# Patient Record
Sex: Male | Born: 1985 | Race: Black or African American | Hispanic: No | Marital: Single | Smoking: Never smoker
Health system: Southern US, Community
[De-identification: ages and names within clinical notes are randomized; demographics above are authoritative.]

## PROBLEM LIST (undated history)

## (undated) DIAGNOSIS — I10 Essential (primary) hypertension: Secondary | ICD-10-CM

## (undated) DIAGNOSIS — F1910 Other psychoactive substance abuse, uncomplicated: Secondary | ICD-10-CM

## (undated) DIAGNOSIS — K469 Unspecified abdominal hernia without obstruction or gangrene: Secondary | ICD-10-CM

## (undated) DIAGNOSIS — F319 Bipolar disorder, unspecified: Secondary | ICD-10-CM

## (undated) DIAGNOSIS — K649 Unspecified hemorrhoids: Secondary | ICD-10-CM

## (undated) HISTORY — DX: Other psychoactive substance abuse, uncomplicated: F19.10

## (undated) HISTORY — PX: COLONOSCOPY: SHX174

---

## 2009-04-08 ENCOUNTER — Emergency Department (HOSPITAL_COMMUNITY): Admission: EM | Admit: 2009-04-08 | Discharge: 2009-04-09 | Payer: Self-pay | Admitting: Emergency Medicine

## 2009-06-26 ENCOUNTER — Emergency Department (HOSPITAL_COMMUNITY): Admission: EM | Admit: 2009-06-26 | Discharge: 2009-06-26 | Payer: Self-pay | Admitting: Emergency Medicine

## 2009-06-30 ENCOUNTER — Emergency Department (HOSPITAL_COMMUNITY): Admission: EM | Admit: 2009-06-30 | Discharge: 2009-06-30 | Payer: Self-pay | Admitting: Emergency Medicine

## 2009-08-07 ENCOUNTER — Encounter: Admission: RE | Admit: 2009-08-07 | Discharge: 2009-08-07 | Payer: Self-pay | Admitting: Occupational Medicine

## 2010-01-04 ENCOUNTER — Emergency Department (HOSPITAL_COMMUNITY): Admission: EM | Admit: 2010-01-04 | Discharge: 2010-01-05 | Payer: Self-pay | Admitting: Emergency Medicine

## 2010-09-06 IMAGING — CR DG ABDOMEN ACUTE W/ 1V CHEST
3 series · 3 of 3 positions shown · non-contrast
Comparison: None

CLINICAL DATA: Fever.  Cough.  Abdominal pain.

ACUTE ABDOMEN SERIES (ABDOMEN 2 VIEW & CHEST 1 VIEW)

[w chest pa]
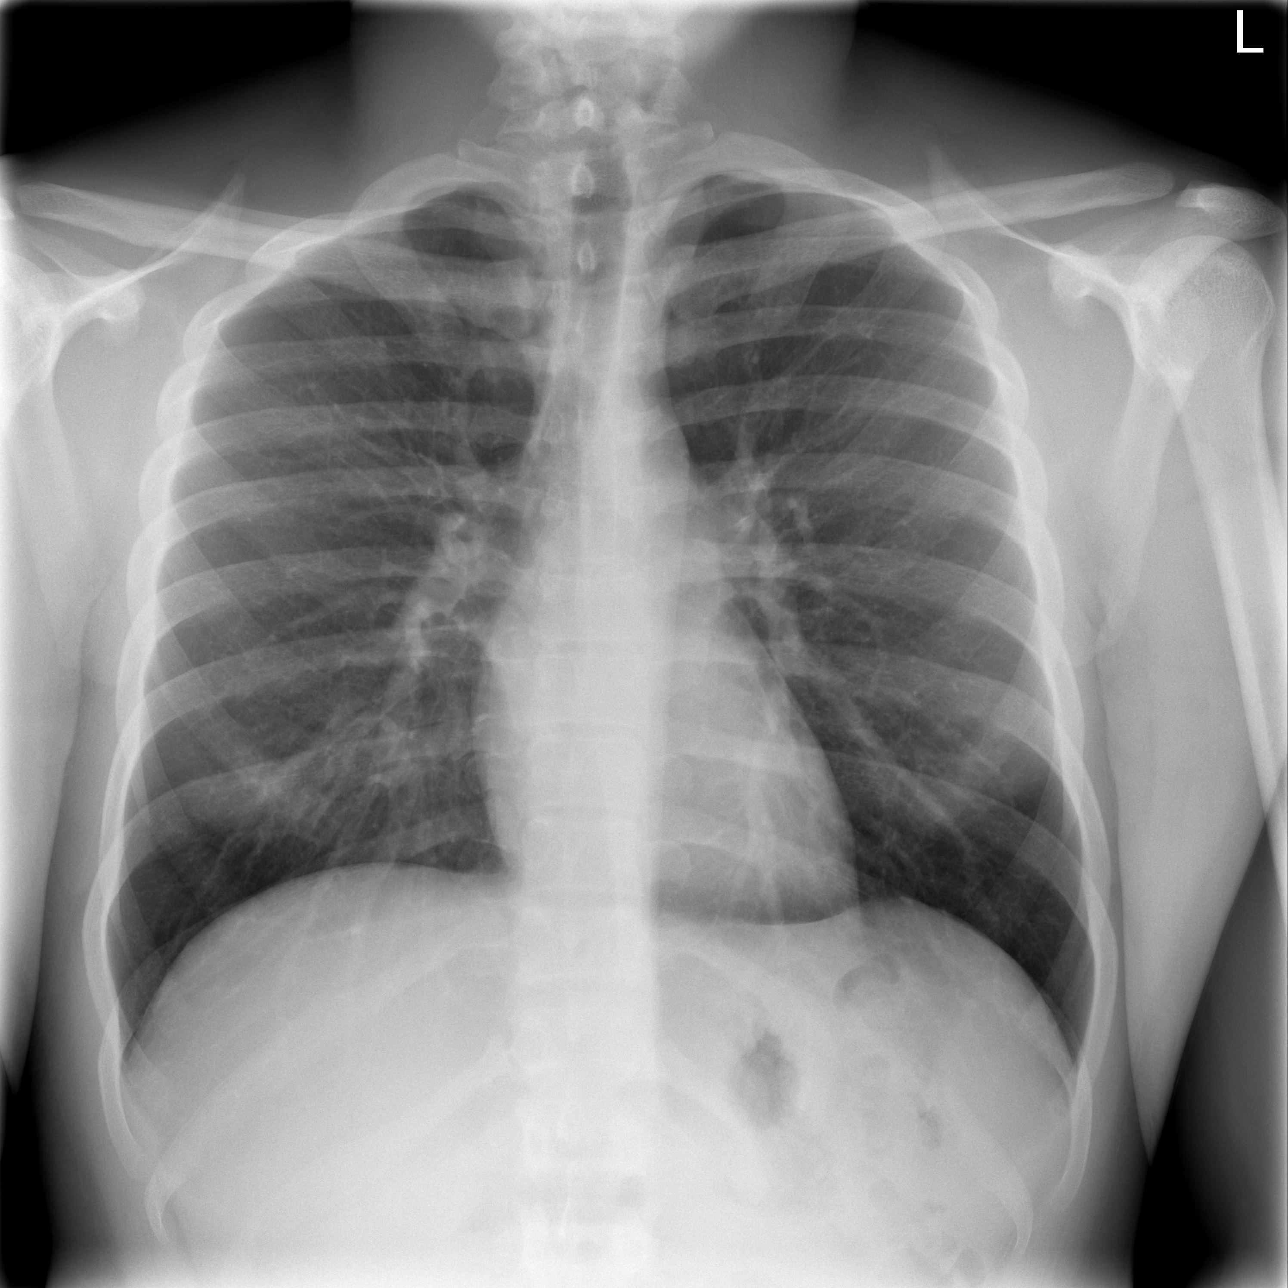

[w abdomen upright]
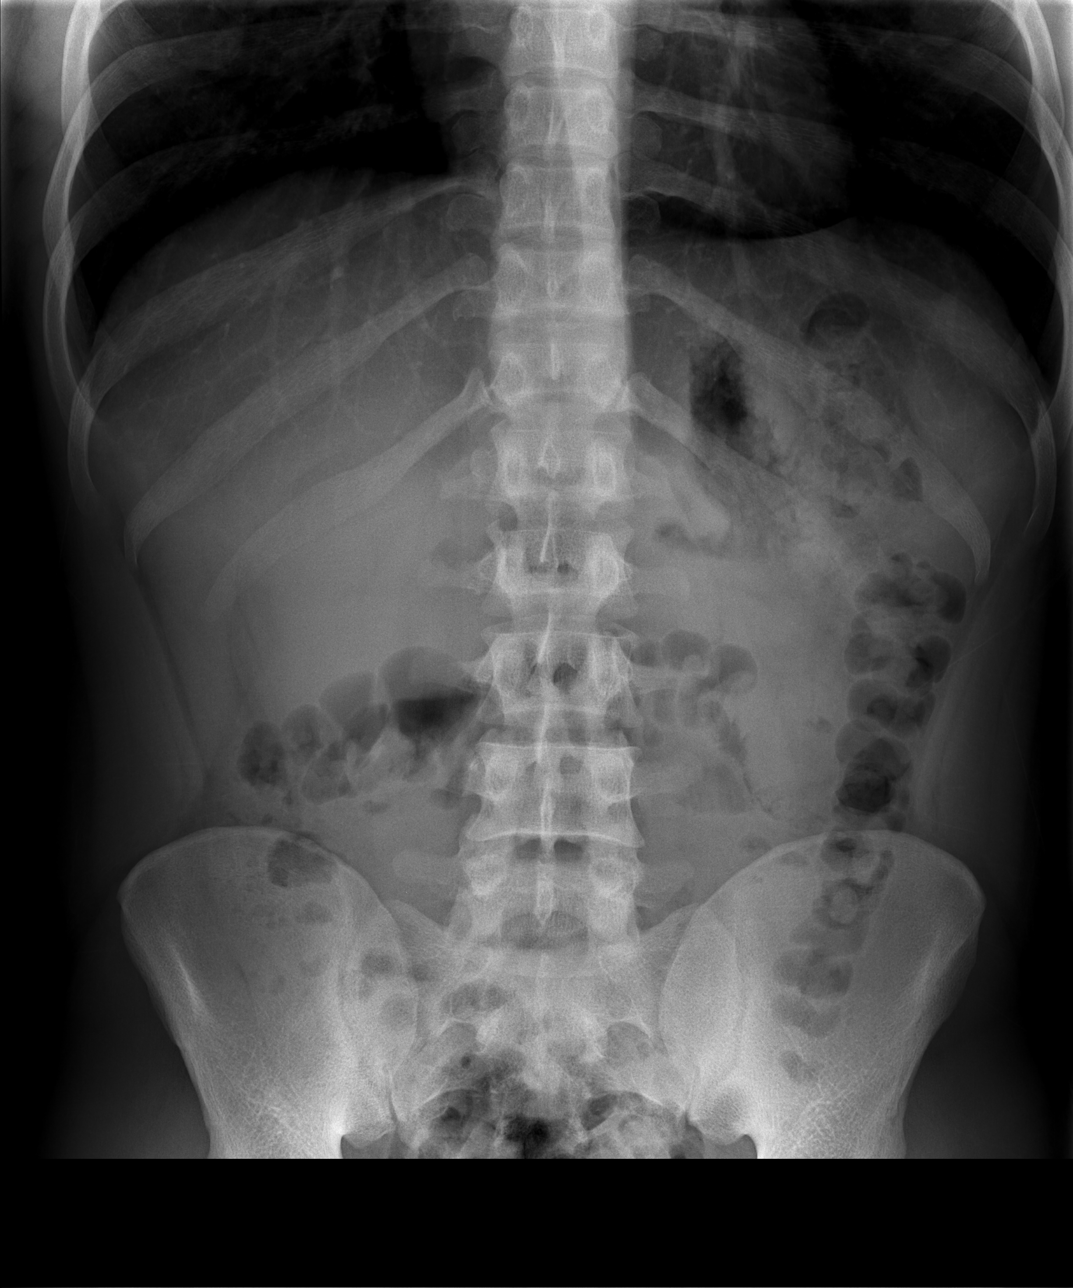

[t abdomen supine]
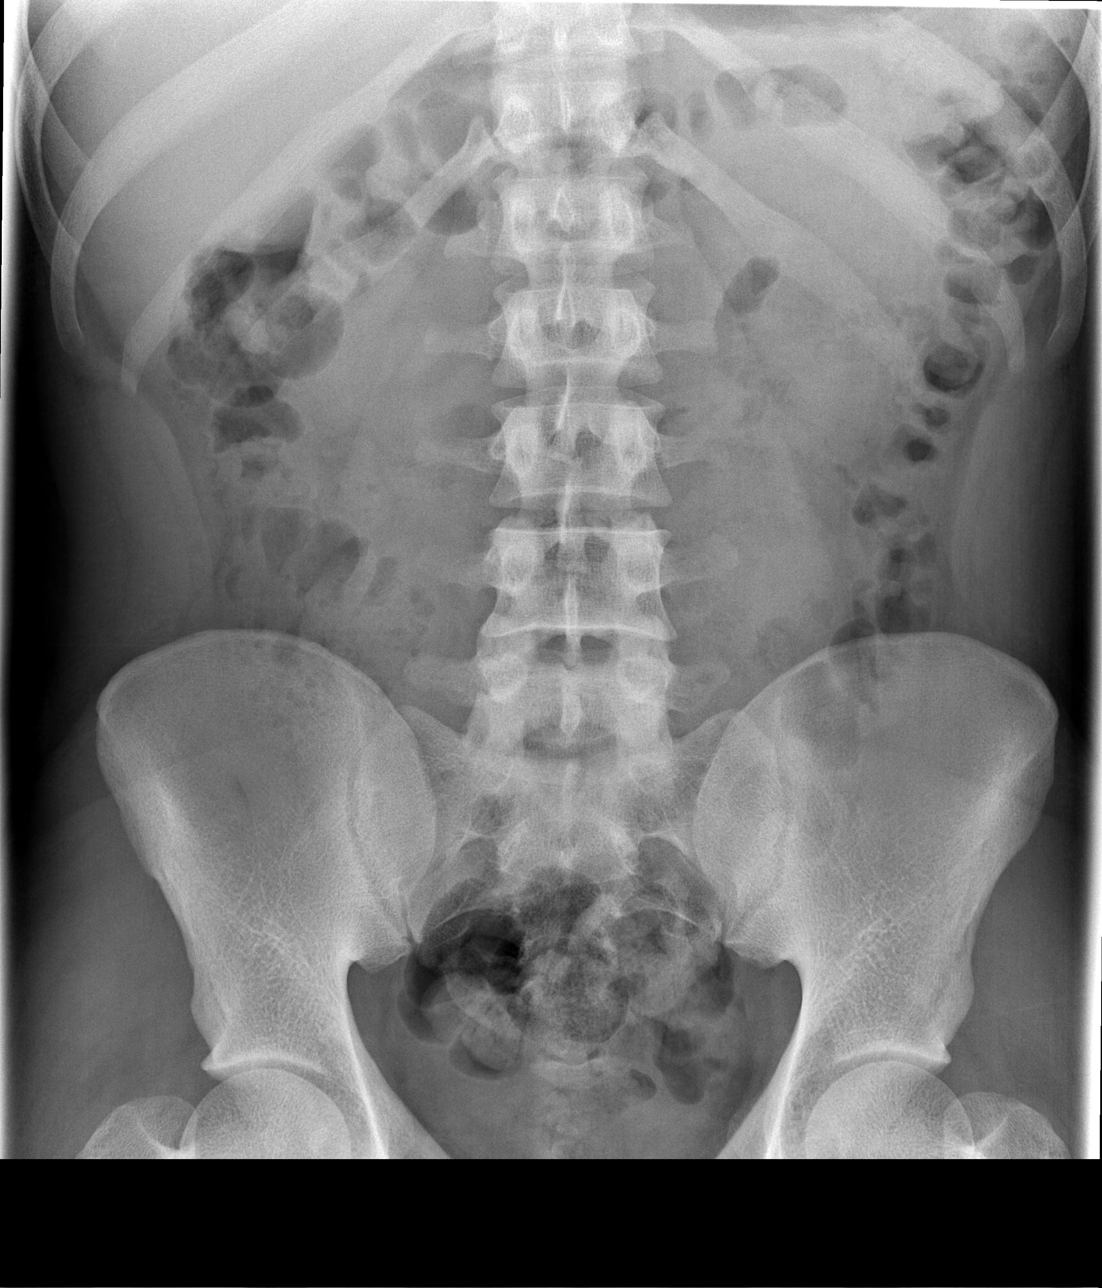

[3 of 3 positions shown; findings below may reference images not displayed]

FINDINGS: Bowel gas pattern is normal without evidence of ileus,
obstruction or free air.  No abnormal calcifications or bony
findings.

One-view chest shows normal heart and mediastinal shadows.  Lungs
are clear.  No infiltrate, collapse or effusion.  No abnormal bony
finding.
IMPRESSION: Negative radiographs

## 2010-10-28 LAB — URINE MICROSCOPIC-ADD ON

## 2010-10-28 LAB — URINALYSIS, ROUTINE W REFLEX MICROSCOPIC
Glucose, UA: NEGATIVE mg/dL
Ketones, ur: NEGATIVE mg/dL
Leukocytes, UA: NEGATIVE
Specific Gravity, Urine: 1.029 (ref 1.005–1.030)
pH: 6 (ref 5.0–8.0)

## 2010-11-13 LAB — URINALYSIS, ROUTINE W REFLEX MICROSCOPIC
Glucose, UA: NEGATIVE mg/dL
Hgb urine dipstick: NEGATIVE
Ketones, ur: >80 mg/dL — AB
Specific Gravity, Urine: 1.038 — ABNORMAL HIGH (ref 1.005–1.030)
pH: 7 (ref 5.0–8.0)

## 2010-11-13 LAB — CBC
HCT: 44.6 % (ref 39.0–52.0)
Hemoglobin: 15 g/dL (ref 13.0–17.0)
RBC: 5.53 MIL/uL (ref 4.22–5.81)
WBC: 12.8 10*3/uL — ABNORMAL HIGH (ref 4.0–10.5)

## 2010-11-13 LAB — COMPREHENSIVE METABOLIC PANEL
ALT: 87 U/L — ABNORMAL HIGH (ref 0–53)
Alkaline Phosphatase: 64 U/L (ref 39–117)
BUN: 8 mg/dL (ref 6–23)
Chloride: 101 mEq/L (ref 96–112)
Glucose, Bld: 98 mg/dL (ref 70–99)
Potassium: 4.4 mEq/L (ref 3.5–5.1)
Sodium: 135 mEq/L (ref 135–145)
Total Bilirubin: 0.9 mg/dL (ref 0.3–1.2)
Total Protein: 8 g/dL (ref 6.0–8.3)

## 2010-11-13 LAB — DIFFERENTIAL
Basophils Absolute: 0 10*3/uL (ref 0.0–0.1)
Basophils Relative: 0 % (ref 0–1)
Eosinophils Absolute: 0.1 10*3/uL (ref 0.0–0.7)
Monocytes Absolute: 0.9 10*3/uL (ref 0.1–1.0)
Monocytes Relative: 7 % (ref 3–12)
Neutro Abs: 5.9 10*3/uL (ref 1.7–7.7)
Neutrophils Relative %: 46 % (ref 43–77)

## 2010-11-13 LAB — URINE MICROSCOPIC-ADD ON

## 2010-11-13 LAB — MONONUCLEOSIS SCREEN: Mono Screen: NEGATIVE

## 2010-11-16 LAB — BASIC METABOLIC PANEL
CO2: 27 mEq/L (ref 19–32)
Calcium: 9 mg/dL (ref 8.4–10.5)
Creatinine, Ser: 0.97 mg/dL (ref 0.4–1.5)
GFR calc Af Amer: >60 mL/min (ref >60)
GFR calc non Af Amer: >60 mL/min (ref >60)
Glucose, Bld: 91 mg/dL (ref 70–99)
Sodium: 138 mEq/L (ref 135–145)

## 2010-11-16 LAB — DIFFERENTIAL
Basophils Relative: 0 % (ref 0–1)
Eosinophils Relative: 4 % (ref 0–5)
Lymphs Abs: 1.9 10*3/uL (ref 0.7–4.0)
Monocytes Absolute: 0.7 10*3/uL (ref 0.1–1.0)
Neutro Abs: 3.6 10*3/uL (ref 1.7–7.7)

## 2010-11-16 LAB — CBC
MCHC: 32.4 g/dL (ref 30.0–36.0)
RDW: 14 % (ref 11.5–15.5)

## 2010-11-16 LAB — POCT CARDIAC MARKERS
CKMB, poc: <1 ng/mL — ABNORMAL LOW (ref 1.0–8.0)
Myoglobin, poc: 41.4 ng/mL (ref 12–200)

## 2011-08-12 ENCOUNTER — Encounter: Payer: Self-pay | Admitting: *Deleted

## 2011-08-12 ENCOUNTER — Emergency Department (HOSPITAL_COMMUNITY)
Admission: EM | Admit: 2011-08-12 | Discharge: 2011-08-12 | Disposition: A | Discharge status: Home / Self Care | Payer: 59 | Source: Home / Self Care

## 2011-08-12 DIAGNOSIS — M25562 Pain in left knee: Secondary | ICD-10-CM

## 2011-08-12 DIAGNOSIS — M25569 Pain in unspecified knee: Secondary | ICD-10-CM

## 2011-08-12 NOTE — ED Notes (Signed)
PT  RE[PORTS  PROBLEMS  WITH  L  KNEE  IN PAST   HE  REPORTS  INCREASE  IN  PAIN    OVER  LAST  SEVERAL  DAYS  HE  REPORTS  PAIN ON  WEIGHT BEARING     HE  DENYS  ANY  RECENT  SPECEFIC  INJURY

## 2011-08-12 NOTE — ED Provider Notes (Signed)
History     CSN: [id number]  Arrival date & time 08/12/11  1344   None     Chief Complaint  Patient presents with  . Knee Pain    (Consider location/radiation/quality/duration/timing/severity/associated sxs/prior treatment) HPI Comments: Anthony Mullen is a very nice man with c/o Lt knee pain for several months. 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 He states that he has intermittently had discomfort which he has noticed more at the end of a work shift until the last several days. Pain has worsened in lateral knee and he know also feels a grinding or clicking in the lateral knee at times too. He has had no swelling. Tylenol provides some relief. He denies specific injury though he is active and has fallen onto his knees many times.    History reviewed. No pertinent past medical history.  History reviewed. No pertinent past surgical history.  History reviewed. No pertinent family history.  History  Substance Use Topics  . Smoking status: Never Smoker   . Smokeless tobacco: Not on file  . Alcohol Use: Yes      Review of Systems  Constitutional: Negative for fever and chills.  Musculoskeletal: Negative for myalgias, back pain and joint swelling.    Allergies  Review of patient's allergies indicates no known allergies.  Home Medications  No current outpatient prescriptions on file.  BP 131/76  Pulse 63  Temp(Src) 98.3 F (36.8 C) (Oral)  Resp 14  SpO2 99%  Physical Exam  Nursing note and vitals reviewed. Constitutional: He appears well-developed and well-nourished. No distress.  Musculoskeletal:       Left knee: He exhibits normal range of motion, no swelling, no effusion, no ecchymosis, no deformity, no erythema, normal alignment, no LCL laxity, normal patellar mobility, no bony tenderness, normal meniscus and no MCL laxity. no tenderness found. No medial joint line, no lateral joint line, no MCL, no LCL and no patellar tendon tenderness noted.     Legs: Neurological: He is alert.  Skin: Skin is warm and dry. No erythema.  Psychiatric: He has a normal mood and affect.    ED Course  Procedures (including critical care time)  Labs Reviewed - No data to display No results found.   1. Left knee pain       MDM   Lateral Lt knee pain, suspect torn meniscus. Pt referred to ortho for f/u.        Melody Comas, [place] 08/12/11 [phone]

## 2011-08-12 NOTE — ED Provider Notes (Signed)
Medical screening examination/treatment/procedure(s) were performed by non-physician practitioner and as supervising physician I was immediately available for consultation/collaboration.  Raynald Blend, MD 08/12/11 [phone]

## 2012-04-09 ENCOUNTER — Encounter (HOSPITAL_COMMUNITY): Payer: Self-pay | Admitting: *Deleted

## 2012-04-09 ENCOUNTER — Emergency Department (HOSPITAL_COMMUNITY): Admission: EM | Admit: 2012-04-09 | Payer: 59 | Source: Home / Self Care

## 2012-04-09 ENCOUNTER — Emergency Department (HOSPITAL_COMMUNITY)
Admission: EM | Admit: 2012-04-09 | Discharge: 2012-04-09 | Disposition: A | Discharge status: Home / Self Care | Payer: Worker's Compensation | Attending: Emergency Medicine | Admitting: Emergency Medicine

## 2012-04-09 DIAGNOSIS — Z7721 Contact with and (suspected) exposure to potentially hazardous body fluids: Secondary | ICD-10-CM | POA: Insufficient documentation
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
## 2012-09-14 ENCOUNTER — Encounter (HOSPITAL_COMMUNITY): Payer: Self-pay | Admitting: *Deleted

## 2012-09-14 ENCOUNTER — Emergency Department (HOSPITAL_COMMUNITY)
Admission: EM | Admit: 2012-09-14 | Discharge: 2012-09-15 | Disposition: A | Discharge status: Home / Self Care | Payer: 59 | Attending: Emergency Medicine | Admitting: Emergency Medicine

## 2012-09-14 DIAGNOSIS — K921 Melena: Secondary | ICD-10-CM | POA: Insufficient documentation

## 2012-09-14 DIAGNOSIS — K625 Hemorrhage of anus and rectum: Secondary | ICD-10-CM | POA: Insufficient documentation

## 2012-09-14 LAB — COMPREHENSIVE METABOLIC PANEL
ALT: 10 U/L (ref 0–53)
Albumin: 4.5 g/dL (ref 3.5–5.2)
Calcium: 9.5 mg/dL (ref 8.4–10.5)
GFR calc Af Amer: >90 mL/min (ref >90)
Glucose, Bld: 94 mg/dL (ref 70–99)
Sodium: 140 mEq/L (ref 135–145)
Total Protein: 7.8 g/dL (ref 6.0–8.3)

## 2012-09-14 LAB — CBC WITH DIFFERENTIAL/PLATELET
Basophils Relative: 0 % (ref 0–1)
Eosinophils Absolute: 0 10*3/uL (ref 0.0–0.7)
Eosinophils Relative: 0 % (ref 0–5)
Lymphs Abs: 2.6 10*3/uL (ref 0.7–4.0)
MCH: 27.1 pg (ref 26.0–34.0)
MCHC: 34.3 g/dL (ref 30.0–36.0)
MCV: 79.1 fL (ref 78.0–100.0)
Platelets: 281 10*3/uL (ref 150–400)
RBC: 5.31 MIL/uL (ref 4.22–5.81)
RDW: 14 % (ref 11.5–15.5)

## 2012-09-14 LAB — OCCULT BLOOD, POC DEVICE: Fecal Occult Bld: POSITIVE — AB

## 2012-09-14 LAB — ABO/RH: ABO/RH(D): A POS

## 2012-09-14 LAB — TYPE AND SCREEN: ABO/RH(D): A POS

## 2012-09-14 NOTE — ED Notes (Signed)
Pt states that he has been thirsty over the past couple of days, pt states that he also has an increased hunger, pt state that he eats and then has severe abdominal cramping. Pt states that 4 days ago he noticed black stools and over the last four days he has been having redness increased to a bright red. Pt denies cramping now but states some discomfort. Pt alert and oriented. Pt denies vomiting some nausea.

## 2012-09-14 NOTE — ED Provider Notes (Addendum)
History     CSN: [id number]  Arrival date & time 09/14/12  1959   First MD Initiated Contact with Patient 09/14/12 2204      Chief Complaint  Patient presents with  . Rectal Bleeding    (Consider location/radiation/quality/duration/timing/severity/associated sxs/prior treatment) Patient is a 27 y.o. male presenting with hematochezia. The history is provided by the patient.  Rectal Bleeding  Pertinent negatives include no fever, no abdominal pain, no hematuria, no chest pain, no coughing and no rash.  pt c/o rectal bleeding today. States recent health at baseline, having normal bms. Pt states normal for him is 2-4 bms per day. States today had 3 bms, formed, soft, brown, but with blood streaks and/or blood around stool. Bright red. No melena. Denies abdominal pain. No nv. No abd distension. No rectal pain or injury. No recent constipation or straining. Hx hemorrhoids. No hx anal fissure. No hx prior gi bleeding. No hx gi bleed, no hx diverticula, no hx avm.  Denies any nsaid or asa use. No faintness, lightheadedness or syncope.   History reviewed. No pertinent past medical history.  History reviewed. No pertinent past surgical history.  History reviewed. No pertinent family history.  History  Substance Use Topics  . Smoking status: Never Smoker   . Smokeless tobacco: Not on file  . Alcohol Use: Yes      Review of Systems  Constitutional: Negative for fever and chills.  HENT: Negative for nosebleeds.   Respiratory: Negative for cough.   Cardiovascular: Negative for chest pain and leg swelling.  Gastrointestinal: Positive for blood in stool and hematochezia. Negative for abdominal pain.  Genitourinary: Negative for hematuria and flank pain.  Musculoskeletal: Negative for back pain.  Skin: Negative for rash.  Neurological: Negative for syncope and light-headedness.  Hematological: Does not bruise/bleed easily.  Psychiatric/Behavioral: Negative for confusion.    Allergies   Review of patient's allergies indicates no known allergies.  Home Medications  No current outpatient prescriptions on file.  BP 140/62  Pulse 106  Temp 98.7 F (37.1 C) (Oral)  Resp 16  SpO2 100%  Physical Exam  Nursing note and vitals reviewed. Constitutional: He is oriented to person, place, and time. He appears well-developed and well-nourished. No distress.       Conj/nail beds, normal color.   HENT:  Mouth/Throat: Oropharynx is clear and moist.  Eyes: Conjunctivae normal are normal. No scleral icterus.  Neck: Neck supple. No tracheal deviation present.  Cardiovascular: Normal rate, regular rhythm, normal heart sounds and intact distal pulses.   Pulmonary/Chest: Effort normal and breath sounds normal. No accessory muscle usage. No respiratory distress.  Abdominal: Soft. Bowel sounds are normal. He exhibits no distension and no mass. There is no tenderness. There is no rebound and no guarding.  Genitourinary:       Pt complies/tolerates rectal exam for split second - inadequate to r/o/feel mass.  Light brown stool, streaks blood/mucous, heme pos.   Musculoskeletal: Normal range of motion. He exhibits no edema and no tenderness.  Neurological: He is alert and oriented to person, place, and time.  Skin: Skin is warm and dry.       No petechia  Psychiatric: He has a normal mood and affect.    ED Course  Procedures (including critical care time)  Results for orders placed during the hospital encounter of 09/14/12  CBC WITH DIFFERENTIAL      Component Value Range   WBC 8.7  4.0 - 10.5 K/uL   RBC 5.31  4.22 - 5.81 MIL/uL   Hemoglobin 14.4  13.0 - 17.0 g/dL   HCT 16.1  09.6 - 04.5 %   MCV 79.1  78.0 - 100.0 fL   MCH 27.1  26.0 - 34.0 pg   MCHC 34.3  30.0 - 36.0 g/dL   RDW 40.9  81.1 - 91.4 %   Platelets 281  150 - 400 K/uL   Neutrophils Relative 62  43 - 77 %   Neutro Abs 5.4  1.7 - 7.7 K/uL   Lymphocytes Relative 30  12 - 46 %   Lymphs Abs 2.6  0.7 - 4.0 K/uL    Monocytes Relative 8  3 - 12 %   Monocytes Absolute 0.7  0.1 - 1.0 K/uL   Eosinophils Relative 0  0 - 5 %   Eosinophils Absolute 0.0  0.0 - 0.7 K/uL   Basophils Relative 0  0 - 1 %   Basophils Absolute 0.0  0.0 - 0.1 K/uL  COMPREHENSIVE METABOLIC PANEL      Component Value Range   Sodium 140  135 - 145 mEq/L   Potassium 3.5  3.5 - 5.1 mEq/L   Chloride 104  96 - 112 mEq/L   CO2 23  19 - 32 mEq/L   Glucose, Bld 94  70 - 99 mg/dL   BUN 16  6 - 23 mg/dL   Creatinine, Ser 7.82  0.50 - 1.35 mg/dL   Calcium 9.5  8.4 - 95.6 mg/dL   Total Protein 7.8  6.0 - 8.3 g/dL   Albumin 4.5  3.5 - 5.2 g/dL   AST 19  0 - 37 U/L   ALT 10  0 - 53 U/L   Alkaline Phosphatase 56  39 - 117 U/L   Total Bilirubin 0.2 (*) 0.3 - 1.2 mg/dL   GFR calc non Af Amer >90  >90 mL/min   GFR calc Af Amer >90  >90 mL/min  TYPE AND SCREEN      Component Value Range   ABO/RH(D) A POS     Antibody Screen NEG     Sample Expiration 09/17/2012    ABO/RH      Component Value Range   ABO/RH(D) A POS    OCCULT BLOOD, POC DEVICE      Component Value Range   Fecal Occult Bld POSITIVE (*) NEGATIVE      MDM  Labs.  hgb normal. Bun normal. Light brown stool, no melena. Vitals normal. abd soft nt no pain As such feel stable for d/c.  Will refer to close gi f/u.    Recheck no episodes rectal bleeding or bloody bms in ed. abd soft nt.  Ambulatory, no faintness or dizziness.  Pt appears stable for d/c. Discussed plan of close gi f/u w pt.      Suzi Roots, MD 09/14/12 2130  Suzi Roots, MD 09/14/12 [phone]

## 2012-09-15 NOTE — ED Notes (Signed)
Pt ambulating independently w/ steady gait on d/c in no acute distress, A&Ox4.D/c instructions reviewed w/ pt and family - pt and family deny any further questions or concerns at present.  

## 2012-12-08 ENCOUNTER — Encounter (HOSPITAL_COMMUNITY): Payer: Self-pay

## 2012-12-08 ENCOUNTER — Emergency Department (HOSPITAL_COMMUNITY)
Admission: EM | Admit: 2012-12-08 | Discharge: 2012-12-08 | Disposition: A | Discharge status: Home / Self Care | Payer: 59 | Attending: Emergency Medicine | Admitting: Emergency Medicine

## 2012-12-08 DIAGNOSIS — Z8719 Personal history of other diseases of the digestive system: Secondary | ICD-10-CM | POA: Insufficient documentation

## 2012-12-08 DIAGNOSIS — R112 Nausea with vomiting, unspecified: Secondary | ICD-10-CM | POA: Insufficient documentation

## 2012-12-08 DIAGNOSIS — Z9889 Other specified postprocedural states: Secondary | ICD-10-CM | POA: Insufficient documentation

## 2012-12-08 DIAGNOSIS — R109 Unspecified abdominal pain: Secondary | ICD-10-CM | POA: Insufficient documentation

## 2012-12-08 DIAGNOSIS — R197 Diarrhea, unspecified: Secondary | ICD-10-CM | POA: Insufficient documentation

## 2012-12-08 DIAGNOSIS — I10 Essential (primary) hypertension: Secondary | ICD-10-CM | POA: Insufficient documentation

## 2012-12-08 HISTORY — DX: Essential (primary) hypertension: I10

## 2012-12-08 LAB — CBC WITH DIFFERENTIAL/PLATELET
Basophils Absolute: 0 10*3/uL (ref 0.0–0.1)
Eosinophils Relative: 1 % (ref 0–5)
Lymphocytes Relative: 9 % — ABNORMAL LOW (ref 12–46)
MCV: 75.8 fL — ABNORMAL LOW (ref 78.0–100.0)
Neutrophils Relative %: 85 % — ABNORMAL HIGH (ref 43–77)
Platelets: 272 10*3/uL (ref 150–400)
RDW: 13.7 % (ref 11.5–15.5)
WBC: 15 10*3/uL — ABNORMAL HIGH (ref 4.0–10.5)

## 2012-12-08 LAB — COMPREHENSIVE METABOLIC PANEL
ALT: 15 U/L (ref 0–53)
AST: 19 U/L (ref 0–37)
CO2: 23 mEq/L (ref 19–32)
Calcium: 10 mg/dL (ref 8.4–10.5)
GFR calc non Af Amer: >90 mL/min (ref >90)
Potassium: 4.4 mEq/L (ref 3.5–5.1)
Sodium: 139 mEq/L (ref 135–145)
Total Protein: 8.8 g/dL — ABNORMAL HIGH (ref 6.0–8.3)

## 2012-12-08 MED ORDER — ONDANSETRON 4 MG PO TBDP
8.0000 mg | ORAL_TABLET | Freq: Once | ORAL | Status: AC
  Administered 2012-12-08: 8 mg via ORAL
  Filled 2012-12-08: qty 2

## 2012-12-08 MED ORDER — ONDANSETRON 8 MG PO TBDP: 8.0000 mg | ORAL_TABLET | Freq: Three times a day (TID) | ORAL | Status: DC | PRN

## 2012-12-08 MED ORDER — SODIUM CHLORIDE 0.9 % IV BOLUS (SEPSIS)
1000.0000 mL | Freq: Once | INTRAVENOUS | Status: AC
  Administered 2012-12-08: 1000 mL via INTRAVENOUS

## 2012-12-08 MED ORDER — ONDANSETRON HCL 4 MG/2ML IJ SOLN: 4.0000 mg | Freq: Once | INTRAMUSCULAR | Status: DC

## 2012-12-08 NOTE — ED Notes (Signed)
Patient presents with c/o generalized abdominal pain, nausea, vomiting and diarrhea x 1 day. Unable to tolerate POs. Daughter just recently recovering from "stomach bug." No fevers, sweats or chills.

## 2012-12-08 NOTE — ED Provider Notes (Signed)
History     CSN: [id number]  Arrival date & time 12/08/12  2536   First MD Initiated Contact with Patient 12/08/12 [phone]      Chief Complaint  Patient presents with  . Abdominal Pain  . Nausea  . Emesis    (Consider location/radiation/quality/duration/timing/severity/associated sxs/prior treatment) HPI 27 yo male presents to the ER with complaint of n/v/d with abdominal cramping starting at 6 pm last night.  No fever, chills, no blood noted in stool or emesis.  No travel, no unusual foods.  Pt's daughter with GI illness and fever recently.  Pt had colonoscopy this past month due to rectal bleeding. Was told he had polyp.   Past Medical History  Diagnosis Date  . Hypertension     Past Surgical History  Procedure Laterality Date  . Colonoscopy      No family history on file.  History  Substance Use Topics  . Smoking status: Never Smoker   . Smokeless tobacco: Never Used  . Alcohol Use: Yes      Review of Systems  All other systems reviewed and are negative.    Allergies  Review of patient's allergies indicates no known allergies.  Home Medications   Current Outpatient Rx  Name  Route  Sig  Dispense  Refill  . bismuth subsalicylate (PEPTO BISMOL) 262 MG/15ML suspension   Oral   Take 15 mLs by mouth every 6 (six) hours as needed for indigestion.           BP 126/61  Pulse 93  Temp(Src) 98.9 F (37.2 C) (Oral)  Resp 18  SpO2 98%  Physical Exam  Nursing note and vitals reviewed. Constitutional: He is oriented to person, place, and time. He appears well-developed and well-nourished. He appears distressed (in pain, tearful).  HENT:  Head: Normocephalic and atraumatic.  Nose: Nose normal.  Dry mucous membranes  Neck: Normal range of motion. Neck supple. No JVD present. No tracheal deviation present. No thyromegaly present.  Cardiovascular: Normal rate, regular rhythm, normal heart sounds and intact distal pulses.  Exam reveals no gallop and no  friction rub.   No murmur heard. Pulmonary/Chest: Effort normal and breath sounds normal. No stridor. No respiratory distress. He has no wheezes. He has no rales. He exhibits no tenderness.  Abdominal: Soft. He exhibits no distension and no mass. There is no tenderness. There is no rebound and no guarding.  Decreased bowel sounds.  No pain on palpation  Musculoskeletal: Normal range of motion. He exhibits no edema and no tenderness.  Lymphadenopathy:    He has no cervical adenopathy.  Neurological: He is alert and oriented to person, place, and time. He exhibits normal muscle tone. Coordination normal.  Skin: Skin is warm and dry. No rash noted. No erythema. No pallor.  Psychiatric: He has a normal mood and affect. His behavior is normal. Judgment and thought content normal.    ED Course  Procedures (including critical care time)  Labs Reviewed  CBC WITH DIFFERENTIAL - Abnormal; Notable for the following:    WBC 15.0 (*)    RBC 6.32 (*)    MCV 75.8 (*)    Neutrophils Relative 85 (*)    Neutro Abs 12.8 (*)    Lymphocytes Relative 9 (*)    All other components within normal limits  COMPREHENSIVE METABOLIC PANEL - Abnormal; Notable for the following:    Glucose, Bld 122 (*)    Total Protein 8.8 (*)    All other components within normal limits  LIPASE, BLOOD  URINALYSIS, MICROSCOPIC ONLY   No results found.   1. Nausea vomiting and diarrhea       MDM  27 yo male with n/v/d, abd pain.  Suspect viral gastroenteritis.  Will give zofran, ivf, check baseline labs.   6:48 AM Pt feeling better.  Labs show leukocytosis, most likely from vomiting.  Abd soft, nt, no rebound/guarding.  Will po challenge and plan for d/c home      Olivia Mackie, MD 12/08/12 [phone]

## 2013-03-29 ENCOUNTER — Emergency Department (HOSPITAL_COMMUNITY)
Admission: EM | Admit: 2013-03-29 | Discharge: 2013-03-29 | Disposition: A | Discharge status: Home / Self Care | Payer: Worker's Compensation | Attending: Emergency Medicine | Admitting: Emergency Medicine

## 2013-03-29 ENCOUNTER — Emergency Department (HOSPITAL_COMMUNITY): Payer: Worker's Compensation

## 2013-03-29 ENCOUNTER — Encounter (HOSPITAL_COMMUNITY): Payer: Self-pay | Admitting: Emergency Medicine

## 2013-03-29 DIAGNOSIS — I10 Essential (primary) hypertension: Secondary | ICD-10-CM | POA: Insufficient documentation

## 2013-03-29 DIAGNOSIS — R0602 Shortness of breath: Secondary | ICD-10-CM | POA: Insufficient documentation

## 2013-03-29 DIAGNOSIS — Z7982 Long term (current) use of aspirin: Secondary | ICD-10-CM | POA: Insufficient documentation

## 2013-03-29 DIAGNOSIS — R079 Chest pain, unspecified: Secondary | ICD-10-CM

## 2013-03-29 DIAGNOSIS — R61 Generalized hyperhidrosis: Secondary | ICD-10-CM | POA: Insufficient documentation

## 2013-03-29 LAB — POCT I-STAT, CHEM 8
BUN: 10 mg/dL (ref 6–23)
Calcium, Ion: 1.21 mmol/L (ref 1.12–1.23)
Chloride: 106 mEq/L (ref 96–112)
Creatinine, Ser: 1 mg/dL (ref 0.50–1.35)
Glucose, Bld: 96 mg/dL (ref 70–99)
HCT: 44 % (ref 39.0–52.0)
Hemoglobin: 15 g/dL (ref 13.0–17.0)
Potassium: 4 mEq/L (ref 3.5–5.1)
Sodium: 142 mEq/L (ref 135–145)
TCO2: 26 mmol/L (ref 0–100)

## 2013-03-29 LAB — CBC WITH DIFFERENTIAL/PLATELET
Eosinophils Absolute: 0.2 10*3/uL (ref 0.0–0.7)
Eosinophils Relative: 3 % (ref 0–5)
Hemoglobin: 14.1 g/dL (ref 13.0–17.0)
Lymphs Abs: 1.8 10*3/uL (ref 0.7–4.0)
MCH: 26.3 pg (ref 26.0–34.0)
MCV: 79.5 fL (ref 78.0–100.0)
Monocytes Relative: 10 % (ref 3–12)
RBC: 5.37 MIL/uL (ref 4.22–5.81)

## 2013-03-29 LAB — TROPONIN I: Troponin I: <0.3 ng/mL (ref <0.30)

## 2013-03-29 LAB — D-DIMER, QUANTITATIVE: D-Dimer, Quant: <0.27 ug/mL-FEU (ref 0.00–0.48)

## 2013-03-29 MED ORDER — ASPIRIN 81 MG PO CHEW: 81.0000 mg | CHEWABLE_TABLET | Freq: Every day | ORAL | Status: DC

## 2013-03-29 NOTE — ED Provider Notes (Signed)
CSN: [id number]     Arrival date & time 03/29/13  1110 History     First MD Initiated Contact with Patient 03/29/13 1120     Chief Complaint  Patient presents with  . Chest Pain   (Consider location/radiation/quality/duration/timing/severity/associated sxs/prior Treatment) HPI Comments: 27 year old male with no significant past medical history who presents with a complaint of chest pain. 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 This was intense and felt as if he was going to get down to his knees, he did not have any syncope, he was diaphoretic. The symptoms were persistent and lasted approximately 10 minutes, have gradually eased off, received aspirin and nitroglycerin in the ambulance which may have helped. He is very active, lifts weights frequently and does cardio activity at the gym and never has to stop because of these symptoms. He does not smoke cigarettes, drinks occasional alcohol, takes no other per prescription medications.  Patient is a 27 y.o. male presenting with chest pain.  Chest Pain   Past Medical History  Diagnosis Date  . Hypertension    Past Surgical History  Procedure Laterality Date  . Colonoscopy     History reviewed. No pertinent family history. History  Substance Use Topics  . Smoking status: Never Smoker   . Smokeless tobacco: Never Used  . Alcohol Use: Yes    Review of Systems  Cardiovascular: Positive for chest pain.  All other systems reviewed and are negative.    Allergies  Review of patient's allergies indicates no known allergies.  Home Medications   Current Outpatient Rx  Name  Route  Sig  Dispense  Refill  . hyoscyamine (LEVSIN, ANASPAZ) 0.125 MG tablet   Oral   Take 0.125 mg by mouth 4 (four) times daily as needed  for cramping.         Marland Kitchen aspirin 81 MG chewable tablet   Oral   Chew 1 tablet (81 mg total) by mouth daily.   30 tablet   0    BP 115/48  Pulse 58  Temp(Src) 98.2 F (36.8 C) (Oral)  Resp 20  SpO2 97% Physical Exam  Nursing note and vitals reviewed. Constitutional: He appears well-developed and well-nourished. No distress.  HENT:  Head: Normocephalic and atraumatic.  Mouth/Throat: Oropharynx is clear and moist. No oropharyngeal exudate.  Eyes: Conjunctivae and EOM are normal. Pupils are equal, round, and reactive to light. Right eye exhibits no discharge. Left eye exhibits no discharge. No scleral icterus.  Neck: Normal range of motion. Neck supple. No JVD present. No thyromegaly present.  Cardiovascular: Normal rate, regular rhythm, normal heart sounds and intact distal pulses.  Exam reveals no gallop and no friction rub.   No murmur heard. Pulmonary/Chest: Effort normal and breath sounds normal. No respiratory distress. He has no wheezes. He has no rales.  Abdominal: Soft. Bowel sounds are normal. He exhibits no distension and no mass. There is no tenderness.  Musculoskeletal: Normal range of motion. He exhibits no edema and no tenderness.  Lymphadenopathy:    He has no cervical adenopathy.  Neurological: He is alert. Coordination normal.  Skin: Skin is warm and dry. No rash noted. No erythema.  Psychiatric: He has a normal mood and affect. His behavior is normal.    ED Course  Procedures (including critical care time)  Labs Reviewed  CBC WITH DIFFERENTIAL  SEDIMENTATION RATE  TROPONIN I  D-DIMER, QUANTITATIVE  POCT I-STAT, CHEM 8   Dg Chest 2 View  03/29/2013   *RADIOLOGY REPORT*  Clinical Data: Short of breath and chest pain  CHEST - 2 VIEW  Comparison: 08/07/2009  Findings: Heart size and vascularity are normal.  Lungs are clear. Negative for pneumonia or mass.  No pleural effusion.  IMPRESSION: Negative   Original Report Authenticated By: Janeece Riggers, M.D.    1. Chest pain     MDM  The patient is well appearing at this time, he will need an EKG, laboratory workup and a chest x-ray. I will also obtain a d-dimer as his symptoms should not be consistent with unstable angina. He has no risk factors for cardiac disease and I have reviewed his EMS EKGs while he was having chest pain on their initial arrival and they are completely normal with no signs of ischemia. D-dimer pending though the patient denies travel, trauma, immobilization, hormone use, cancer, swelling of the legs, recent surgery.  ED ECG REPORT  I personally interpreted this EKG   Date: 03/29/2013   Rate: 73  Rhythm: normal sinus rhythm  QRS Axis: normal  Intervals: normal  ST/T Wave abnormalities: early repolarization  Conduction Disutrbances:none  Narrative Interpretation:   Old EKG Reviewed: none available  The patient has been chest pain-free and essentially since arrival, blood pressure is normal, labs have been totally normal including sedimentation rate, troponin, d-dimer. His chest x-ray is normal with no signs of pneumothorax or infiltrate and his repeat EKG shows no changes and no ischemia. The patient has essentially no risk factors for heart disease, no significant family history. I informed him that he needs to followup with family doctor or cardiologist for possible stress test should he continue to have intermittent symptoms but to return to the emergency department should his symptoms worsen or become more severe. He has expressed understanding and is in minimal to followup.  ED ECG REPORT  I personally interpreted this EKG   Date: 03/29/2013   Rate: 62  Rhythm: normal sinus rhythm  QRS Axis: normal  Intervals: normal  ST/T Wave abnormalities: normal  Conduction Disutrbances:none  Narrative Interpretation:   Old EKG Reviewed: No acute findings   Vida Roller, MD 03/29/13 1552

## 2013-03-29 NOTE — ED Notes (Signed)
Pt to ED via EMS with c/o chest pain, onset at 1045 today. Pt states sudden onset of right chest pain that radiates to left chest. EMS given nitro sublingual x2 and ASA 324mg , pt now pain free. Per EMS EKG unremarkable, BP-100/64, SpO2-98% on room air, 20G L A/C line inserted by EMS.

## 2013-04-06 ENCOUNTER — Encounter: Payer: Self-pay | Admitting: Cardiology

## 2013-04-06 ENCOUNTER — Ambulatory Visit (INDEPENDENT_AMBULATORY_CARE_PROVIDER_SITE_OTHER): Payer: 59 | Admitting: Cardiology

## 2013-04-06 VITALS — BP 124/78 | HR 61 | Ht 69.0 in | Wt 230.0 lb

## 2013-04-06 DIAGNOSIS — R079 Chest pain, unspecified: Secondary | ICD-10-CM

## 2013-04-06 NOTE — Progress Notes (Signed)
Patient ID: Anthony Mullen, male   DOB: 09/15/1985, 27 y.o.   MRN: [id number] 27 yo with minimal past history presents for evaluation of chest pain.  Patient is a Emergency planning/management officer.  On 8/19, he walked up a flight of steps in an apartment building to knock on a door at the top.  At the top of the steps, he developed very severe sharp pain across his chest.  He went down on his knees because the pain was so bad.  His partner called 911 and EMS arrived.  Patient was given NTG with very fast resolution of chest pain.  Pain lasted 10 minutes total.  He went to the ER for evaluation.  CXR was clear, D dimer and troponin were negative.  He was sent home.  The day he had the chest pain was a hot day and he was in full equipment (belt, vest, etc).    Prior to this, he has had episodes of chest aching for about 6 months.  Typically, episodes occur with exertion when he is wearing full equipment at work.  He works out in a gym and uses an elliptical with no chest pain.  Since 8/19, he continues to have mild chest pain when it is hot out and he is wearing full equipment . No syncope, no palpitations. No prior cardiac history.  He is a nonsmoker.  No family history of cardiac disease that he knows of.   ECG: NSR, early repolarization pattern.   Labs (8/14): K 4, creatinine 1.0, D dimer negative, troponin negative  PMH: No significant medical problems.   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  Nonsmoker. No drugs.    FH: Grandfather with CHF, father with ETOH abuse, siblings healthy, does not know much about mother's medical history.  ROS: All systems reviewed and negative except as per HPI.   Current Outpatient Prescriptions  Medication Sig Dispense Refill  . aspirin 81 MG chewable tablet Chew 1 tablet (81 mg total) by mouth daily.  30 tablet  0   No current facility-administered medications for this visit.    BP 124/78  Pulse 61  Ht 5\' 9"  (1.753 m)  Wt 104.327 kg (230 lb)  BMI  33.95 kg/m2  SpO2 97% General: NAD Neck: No JVD, no thyromegaly or thyroid nodule.  Lungs: Clear to auscultation bilaterally with normal respiratory effort. CV: Nondisplaced PMI.  Heart regular S1/S2, no S3/S4, no murmur.  No peripheral edema.  No carotid bruit.  Normal pedal pulses.  Abdomen: Soft, nontender, no hepatosplenomegaly, no distention.  Skin: Intact without lesions or rashes.  Neurologic: Alert and oriented x 3.  Psych: Normal affect. Extremities: No clubbing or cyanosis.  HEENT: Normal.   Assessment/plan: 27 yo presents with episodes of exertional chest pain. Most severe episode was on 8/19 and was relieved rather promptly with NTG.  Unremarkable ER evaluation.  It is possible the chest pain may be musculoskeletal and related to wearing heavy equipment (belt, vest) as it only seems to occur when he is wearing his equipment at work.  He does not have any obvious risk factors for CAD.  However, given his high risk profession, I think that we probably ought to do an ETT-Cardiolite given exertional symptoms relieved by NTG.  He will continue the ASA 81 for now and I will arrange the stress test.   He needs to complete this evaluation before returning to full duties.    Marca Ancona 04/06/2013

## 2013-04-06 NOTE — Patient Instructions (Addendum)
Your physician has requested that you have en exercise stress myoview. For further information please visit [email or web address] Please follow instruction sheet, as given.  Your physician recommends that you schedule a follow-up appointment as needed with Dr Shirlee Latch if testing is normal.

## 2013-04-07 ENCOUNTER — Ambulatory Visit (HOSPITAL_COMMUNITY): Payer: Worker's Compensation | Attending: Cardiology | Admitting: Radiology

## 2013-04-07 VITALS — BP 120/69 | Ht 69.0 in | Wt 234.0 lb

## 2013-04-07 DIAGNOSIS — R079 Chest pain, unspecified: Secondary | ICD-10-CM | POA: Insufficient documentation

## 2013-04-07 DIAGNOSIS — R0602 Shortness of breath: Secondary | ICD-10-CM | POA: Insufficient documentation

## 2013-04-07 MED ORDER — TECHNETIUM TC 99M SESTAMIBI GENERIC - CARDIOLITE
33.0000 | Freq: Once | INTRAVENOUS | Status: AC | PRN
  Administered 2013-04-07: 33 via INTRAVENOUS

## 2013-04-07 MED ORDER — TECHNETIUM TC 99M SESTAMIBI GENERIC - CARDIOLITE
11.0000 | Freq: Once | INTRAVENOUS | Status: AC | PRN
  Administered 2013-04-07: 11 via INTRAVENOUS

## 2013-04-07 NOTE — Progress Notes (Addendum)
[facility] SITE 3 NUCLEAR MED [address] [place], [place] [address] [phone]    Cardiology Nuclear Med Study  Anthony Mullen is a 27 y.o. male     MRN : [id number]     DOB: 1986-03-28  Procedure Date: 04/07/2013  Nuclear Med Background Indication for Stress Test:  Evaluation for Ischemia, 03/29/13 [facility] ED R/O MI CP and SOB History:  n/a Cardiac Risk Factors: History of Smoking  Symptoms:  Chest Pain and SOB   Nuclear Pre-Procedure Caffeine/Decaff Intake:  None NPO After: 9:00pm   Lungs:  clear O2 Sat: 98% on room air. IV 0.9% NS with Angio Cath:  20g  IV Site: R Antecubital  IV Started by:  Bonnita Levan, RN  Chest Size (in):  46 Cup Size: n/a  Height: 5\' 9"  (1.753 m)  Weight:  234 lb (106.142 kg)  BMI:  Body mass index is 34.54 kg/(m^2). Tech Comments:  This patient got dizzy, chest pain, and nausea as soon as he sat down in recovery. BP dropped, but was blunted at first.     Nuclear Med Study 1 or 2 day study: 1 day  Stress Test Type:  Stress  Reading MD: Olga Millers, MD  Order Authorizing Provider:  Marca Ancona, MD  Resting Radionuclide: Technetium 52m Sestamibi  Resting Radionuclide Dose: 11.0 mCi   Stress Radionuclide:  Technetium 8m Sestamibi  Stress Radionuclide Dose: 33.0 mCi           Stress Protocol Rest HR: 63 Stress HR: 173  Rest BP: 120/69 Stress BP: 182/56  Exercise Time (min): 11:46 METS: 5.0   Predicted Max HR: 195 bpm % Max HR: 88.72 bpm Rate Pressure Product: 21308   Dose of Adenosine (mg):  n/a Dose of Lexiscan: n/a mg  Dose of Atropine (mg): n/a Dose of Dobutamine: n/a mcg/kg/min (at max HR)  Stress Test Technologist: Milana Na, EMT-P  Nuclear Technologist:  Doyne Keel, CNMT     Rest Procedure:  Myocardial perfusion imaging was performed at rest 45 minutes following the intravenous administration of Technetium 61m Sestamibi. Rest ECG: NSR, probable early repolarization abnormality.  Stress Procedure:  The patient  exercised on the treadmill utilizing the Bruce Protocol for 11:46 minutes. The patient stopped due to fatigue, and he got chest pain in recovery.  Technetium 39m Sestamibi was injected at peak exercise and myocardial perfusion imaging was performed after a brief delay. Stress ECG: No significant ST segment change suggestive of ischemia.  QPS Raw Data Images:  Acquisition technically good; normal left ventricular size. Stress Images:  Normal homogeneous uptake in all areas of the myocardium. Rest Images:  Normal homogeneous uptake in all areas of the myocardium. Subtraction (SDS):  No evidence of ischemia. Transient Ischemic Dilatation (Normal <1.22):  n/a Lung/Heart Ratio (Normal <0.45):  0.46  Quantitative Gated Spect Images QGS EDV:  140 ml QGS ESV:  65 ml  Impression Exercise Capacity:  Good exercise capacity. BP Response:  Patient's SBP stage 3 182; peak exercise recorded as 140 but immediate post exercise 166; accuracy of peak exercise SBP unclear. Clinical Symptoms:  There is chest pain. ECG Impression:  No significant ST segment change suggestive of ischemia. Comparison with Prior Nuclear Study: No images to compare  Overall Impression:  Probable normal stress nuclear study (see comments under BP response).  LV Ejection Fraction: 54%.  LV Wall Motion:  NL LV Function; NL Wall Motion  Olga Millers  Good exercise tolerance.  No evidence for ischemia or infarction by  ECG or perfusion images.  BP measurements may be inaccurate.  Think he can go back to work with this study.    Marca Ancona 04/08/2013

## 2013-04-08 ENCOUNTER — Telehealth: Payer: Self-pay | Admitting: Cardiology

## 2013-04-08 NOTE — Telephone Encounter (Signed)
Called patient with results of nuc stress test. He will pick up a copy of the test to show that he can return to work (noted by Dr.McLean).

## 2013-04-08 NOTE — Telephone Encounter (Signed)
New Problem  Pt calling for stress results.

## 2013-04-12 NOTE — Progress Notes (Signed)
LMTCB

## 2013-04-13 NOTE — Progress Notes (Signed)
Pt.notified

## 2014-05-27 ENCOUNTER — Encounter (HOSPITAL_COMMUNITY): Payer: Self-pay | Admitting: Emergency Medicine

## 2014-05-27 ENCOUNTER — Emergency Department (HOSPITAL_COMMUNITY): Payer: Worker's Compensation

## 2014-05-27 ENCOUNTER — Emergency Department (HOSPITAL_COMMUNITY)
Admission: EM | Admit: 2014-05-27 | Discharge: 2014-05-27 | Disposition: A | Discharge status: Home / Self Care | Payer: Worker's Compensation | Attending: Emergency Medicine | Admitting: Emergency Medicine

## 2014-05-27 DIAGNOSIS — I10 Essential (primary) hypertension: Secondary | ICD-10-CM | POA: Insufficient documentation

## 2014-05-27 DIAGNOSIS — S60391A Other superficial injuries of right thumb, initial encounter: Secondary | ICD-10-CM | POA: Insufficient documentation

## 2014-05-27 DIAGNOSIS — M79644 Pain in right finger(s): Secondary | ICD-10-CM

## 2014-05-27 DIAGNOSIS — S8991XA Unspecified injury of right lower leg, initial encounter: Secondary | ICD-10-CM | POA: Insufficient documentation

## 2014-05-27 DIAGNOSIS — Z7982 Long term (current) use of aspirin: Secondary | ICD-10-CM | POA: Insufficient documentation

## 2014-05-27 DIAGNOSIS — M79604 Pain in right leg: Secondary | ICD-10-CM

## 2014-05-27 NOTE — Discharge Instructions (Signed)
Ice and elevate leg and thumb throughout the day. Alternate between ibuprofen and tylenol for pain. See your regular doctor in 1 week as needed for ongoing pain. Return to the ER for changes or worsening symptoms. YOU ARE CLEARED TO RETURN TO WORK.   Musculoskeletal Pain Musculoskeletal pain is muscle and boney aches and pains. These pains can occur in any part of the body. Your caregiver may treat you without knowing the cause of the pain. They may treat you if blood or urine tests, X-rays, and other tests were normal.  CAUSES There is often not a definite cause or reason for these pains. These pains may be caused by a type of germ (virus). The discomfort may also come from overuse. Overuse includes working out too hard when your body is not fit. Boney aches also come from weather changes. Bone is sensitive to atmospheric pressure changes. HOME CARE INSTRUCTIONS   Ask when your test results will be ready. Make sure you get your test results.  Only take over-the-counter or prescription medicines for pain, discomfort, or fever as directed by your caregiver. If you were given medications for your condition, do not drive, operate machinery or power tools, or sign legal documents for 24 hours. Do not drink alcohol. Do not take sleeping pills or other medications that may interfere with treatment.  Continue all activities unless the activities cause more pain. When the pain lessens, slowly resume normal activities. Gradually increase the intensity and duration of the activities or exercise.  During periods of severe pain, bed rest may be helpful. Lay or sit in any position that is comfortable.  Putting ice on the injured area.  Put ice in a bag.  Place a towel between your skin and the bag.  Leave the ice on for 15 to 20 minutes, 3 to 4 times a day.  Follow up with your caregiver for continued problems and no reason can be found for the pain. If the pain becomes worse or does not go away, it may  be necessary to repeat tests or do additional testing. Your caregiver may need to look further for a possible cause. SEEK IMMEDIATE MEDICAL CARE IF:  You have pain that is getting worse and is not relieved by medications.  You develop chest pain that is associated with shortness or breath, sweating, feeling sick to your stomach (nauseous), or throw up (vomit).  Your pain becomes localized to the abdomen.  You develop any new symptoms that seem different or that concern you. MAKE SURE YOU:   Understand these instructions.  Will watch your condition.  Will get help right away if you are not doing well or get worse. Document Released: 07/28/2005 Document Revised: 10/20/2011 Document Reviewed: 04/01/2013 [facility] Patient Information 2015 [place], [place]. This information is not intended to replace advice given to you by your health care provider. Make sure you discuss any questions you have with your health care provider.  Cryotherapy Cryotherapy is when you put ice on your injury. Ice helps lessen pain and puffiness (swelling) after an injury. Ice works the best when you start using it in the first 24 to 48 hours after an injury. HOME CARE  Put a dry or damp towel between the ice pack and your skin.  You may press gently on the ice pack.  Leave the ice on for no more than 10 to 20 minutes at a time.  Check your skin after 5 minutes to make sure your skin is okay.  Rest  at least 20 minutes between ice pack uses.  Stop using ice when your skin loses feeling (numbness).  Do not use ice on someone who cannot tell you when it hurts. This includes small children and people with memory problems (dementia). GET HELP RIGHT AWAY IF:  You have white spots on your skin.  Your skin turns blue or pale.  Your skin feels waxy or hard.  Your puffiness gets worse. MAKE SURE YOU:   Understand these instructions.  Will watch your condition.  Will get help right away if you are not doing  well or get worse. Document Released: 01/14/2008 Document Revised: 10/20/2011 Document Reviewed: 03/20/2011 [facility] Patient Information 2015 [place], [place]. This information is not intended to replace advice given to you by your health care provider. Make sure you discuss any questions you have with your health care provider.
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
## 2014-05-27 NOTE — ED Provider Notes (Signed)
Medical screening examination/treatment/procedure(s) were performed by non-physician practitioner and as supervising physician I was immediately available for consultation/collaboration.   EKG Interpretation None       Mahari Vankirk, MD 05/27/14 1918 

## 2014-10-02 ENCOUNTER — Emergency Department (HOSPITAL_COMMUNITY)
Admission: EM | Admit: 2014-10-02 | Discharge: 2014-10-03 | Disposition: A | Discharge status: Home / Self Care | Payer: 59 | Attending: Emergency Medicine | Admitting: Emergency Medicine

## 2014-10-02 ENCOUNTER — Encounter (HOSPITAL_COMMUNITY): Payer: Self-pay

## 2014-10-02 DIAGNOSIS — R1084 Generalized abdominal pain: Secondary | ICD-10-CM | POA: Diagnosis not present

## 2014-10-02 DIAGNOSIS — G4489 Other headache syndrome: Secondary | ICD-10-CM | POA: Diagnosis not present

## 2014-10-02 DIAGNOSIS — R197 Diarrhea, unspecified: Secondary | ICD-10-CM | POA: Insufficient documentation

## 2014-10-02 DIAGNOSIS — F1010 Alcohol abuse, uncomplicated: Secondary | ICD-10-CM

## 2014-10-02 DIAGNOSIS — I10 Essential (primary) hypertension: Secondary | ICD-10-CM | POA: Diagnosis not present

## 2014-10-02 DIAGNOSIS — R112 Nausea with vomiting, unspecified: Secondary | ICD-10-CM

## 2014-10-02 LAB — CBC WITH DIFFERENTIAL/PLATELET
BASOS PCT: 0 % (ref 0–1)
Basophils Absolute: 0 10*3/uL (ref 0.0–0.1)
Eosinophils Absolute: 0.1 10*3/uL (ref 0.0–0.7)
Eosinophils Relative: 1 % (ref 0–5)
HEMATOCRIT: 44.8 % (ref 39.0–52.0)
HEMOGLOBIN: 15.2 g/dL (ref 13.0–17.0)
Lymphocytes Relative: 10 % — ABNORMAL LOW (ref 12–46)
Lymphs Abs: 1.2 10*3/uL (ref 0.7–4.0)
MCH: 26.7 pg (ref 26.0–34.0)
MCHC: 33.9 g/dL (ref 30.0–36.0)
MCV: 78.7 fL (ref 78.0–100.0)
MONO ABS: 0.7 10*3/uL (ref 0.1–1.0)
Monocytes Relative: 6 % (ref 3–12)
NEUTROS PCT: 83 % — AB (ref 43–77)
Neutro Abs: 10.1 10*3/uL — ABNORMAL HIGH (ref 1.7–7.7)
PLATELETS: 299 10*3/uL (ref 150–400)
RBC: 5.69 MIL/uL (ref 4.22–5.81)
RDW: 14 % (ref 11.5–15.5)
WBC: 12 10*3/uL — ABNORMAL HIGH (ref 4.0–10.5)

## 2014-10-02 LAB — BASIC METABOLIC PANEL
Anion gap: 8 (ref 5–15)
BUN: 12 mg/dL (ref 6–23)
CALCIUM: 9 mg/dL (ref 8.4–10.5)
CHLORIDE: 108 mmol/L (ref 96–112)
CO2: 23 mmol/L (ref 19–32)
Creatinine, Ser: 0.8 mg/dL (ref 0.50–1.35)
GFR calc Af Amer: >90 mL/min (ref >90)
GFR calc non Af Amer: >90 mL/min (ref >90)
Glucose, Bld: 111 mg/dL — ABNORMAL HIGH (ref 70–99)
Potassium: 3.9 mmol/L (ref 3.5–5.1)
SODIUM: 139 mmol/L (ref 135–145)

## 2014-10-02 LAB — HEPATIC FUNCTION PANEL
ALBUMIN: 4.6 g/dL (ref 3.5–5.2)
ALT: 38 U/L (ref 0–53)
AST: 37 U/L (ref 0–37)
Alkaline Phosphatase: 56 U/L (ref 39–117)
BILIRUBIN DIRECT: 0.2 mg/dL (ref 0.0–0.5)
Indirect Bilirubin: 0.4 mg/dL (ref 0.3–0.9)
TOTAL PROTEIN: 8.5 g/dL — AB (ref 6.0–8.3)
Total Bilirubin: 0.6 mg/dL (ref 0.3–1.2)

## 2014-10-02 LAB — LIPASE, BLOOD: LIPASE: 17 U/L (ref 11–59)

## 2014-10-02 MED ORDER — GI COCKTAIL ~~LOC~~
30.0000 mL | Freq: Once | ORAL | Status: AC
  Administered 2014-10-03: 30 mL via ORAL
  Filled 2014-10-02: qty 30

## 2014-10-02 MED ORDER — SODIUM CHLORIDE 0.9 % IV SOLN
1000.0000 mL | Freq: Once | INTRAVENOUS | Status: AC
  Administered 2014-10-02: 1000 mL via INTRAVENOUS

## 2014-10-02 MED ORDER — ONDANSETRON HCL 4 MG/2ML IJ SOLN
4.0000 mg | Freq: Once | INTRAMUSCULAR | Status: AC
  Administered 2014-10-02: 4 mg via INTRAVENOUS
  Filled 2014-10-02: qty 2

## 2014-10-02 MED ORDER — SUCRALFATE 1 G PO TABS
1.0000 g | ORAL_TABLET | Freq: Once | ORAL | Status: AC
  Administered 2014-10-03: 1 g via ORAL
  Filled 2014-10-02: qty 1

## 2014-10-02 MED ORDER — SODIUM CHLORIDE 0.9 % IV SOLN
80.0000 mg | Freq: Once | INTRAVENOUS | Status: AC
  Administered 2014-10-03: 80 mg via INTRAVENOUS
  Filled 2014-10-02: qty 80

## 2014-10-02 NOTE — ED Notes (Signed)
Pt vomited in triage and it was reported that there was blood in his vomit

## 2014-10-02 NOTE — ED Notes (Signed)
Pt states that he drank excessively on Friday and he normally drinks a lot but since then he's been vomiting and having diarrhea, he wants to get into a detox program and he hasn't had anything to drink since Friday. Pt has a slight tremor

## 2014-10-02 NOTE — ED Notes (Signed)
Patient provided urinal and made aware that a urine specimen is needed.

## 2014-10-03 MED ORDER — PANTOPRAZOLE SODIUM 20 MG PO TBEC: 40.0000 mg | DELAYED_RELEASE_TABLET | Freq: Every day | ORAL | Status: DC

## 2014-10-03 MED ORDER — SUCRALFATE 1 G PO TABS
1.0000 g | ORAL_TABLET | Freq: Four times a day (QID) | ORAL | Status: DC
Start: 2014-10-03 — End: 2014-12-13

## 2014-10-03 MED ORDER — ONDANSETRON 8 MG PO TBDP: 8.0000 mg | ORAL_TABLET | Freq: Three times a day (TID) | ORAL | Status: DC | PRN

## 2014-10-03 NOTE — ED Provider Notes (Signed)
CSN: [id number]     Arrival date & time 10/02/14  2105 History   First MD Initiated Contact with Patient 10/02/14 2304     Chief Complaint  Patient presents with  . Abdominal Pain     (Consider location/radiation/quality/duration/timing/severity/associated sxs/prior Treatment) HPI 29 year old male presents to emergency department with complaint of abdominal pain, nausea and vomiting.  Symptoms started on Saturday after an evening of heavy drinking.  Patient reports that he drinks regularly, up to a 750 mL liquor bottle and several beers.  Patient reports he drank more than usual on Friday.  Patient reports he began to feel unwell on Saturday with nausea and vomiting.  He reports he felt better on Sunday, but developed vomiting and diarrhea today.  Patient reports mild shakiness.  He reports that he has been heavily drinking for some time.  The last time he was consistently sober was in November when he was sober for 30 days on its own.  Patient reports he currently has a therapist who is referred him to an addiction specialist.  He begins AA tomorrow.  Patient reports he had 2 episodes of blood-streaked emesis and blood mixed with emesis since being here in the emergency department.  No prior history of varices or GI bleed.  No prior history of alcohol withdrawal seizures or DTs.  Patient feeling better after IV fluids and IV Zofran.  Patient requesting to drink water. Past Medical History  Diagnosis Date  . Hypertension    Past Surgical History  Procedure Laterality Date  . Colonoscopy     Family History  Problem Relation Age of Onset  . Heart failure      grandfather  . Heart failure      cousin  . Breast cancer      grandmother  . Breast cancer      male cousin  . Heart attack      grandfather   History  Substance Use Topics  . Smoking status: Never Smoker   . Smokeless tobacco: Never Used  . Alcohol Use: Yes    Review of Systems   See History of Present Illness;  otherwise all other systems are reviewed and negative  Allergies  Review of patient's allergies indicates no known allergies.  Home Medications   Prior to Admission medications   Not on File   BP 152/77 mmHg  Pulse 104  Temp(Src) 98.7 F (37.1 C) (Oral)  Resp 20  SpO2 100% Physical Exam  Constitutional: He is oriented to person, place, and time. He appears well-developed and well-nourished.  HENT:  Head: Normocephalic and atraumatic.  Nose: Nose normal.  Mouth/Throat: Oropharynx is clear and moist.  Eyes: Conjunctivae and EOM are normal. Pupils are equal, round, and reactive to light.  Neck: Normal range of motion. Neck supple. No JVD present. No tracheal deviation present. No thyromegaly present.  Cardiovascular: Normal rate, regular rhythm, normal heart sounds and intact distal pulses.  Exam reveals no gallop and no friction rub.   No murmur heard. Pulmonary/Chest: Effort normal and breath sounds normal. No stridor. No respiratory distress. He has no wheezes. He has no rales. He exhibits no tenderness.  Abdominal: Soft. Bowel sounds are normal. He exhibits no distension and no mass. There is tenderness (mild diffuse tenderness without rebound or guarding). There is no rebound and no guarding.  Musculoskeletal: Normal range of motion. He exhibits no edema or tenderness.  Lymphadenopathy:    He has no cervical adenopathy.  Neurological: He is alert and oriented  to person, place, and time. He displays normal reflexes. No cranial nerve deficit. He exhibits normal muscle tone. Coordination normal.  Skin: Skin is warm and dry. No rash noted. No erythema. No pallor.  Psychiatric: He has a normal mood and affect. His behavior is normal. Judgment and thought content normal.  Nursing note and vitals reviewed.   ED Course  Procedures (including critical care time) Labs Review Labs Reviewed  BASIC METABOLIC PANEL - Abnormal; Notable for the following:    Glucose, Bld 111 (*)    All  other components within normal limits  CBC WITH DIFFERENTIAL/PLATELET - Abnormal; Notable for the following:    WBC 12.0 (*)    Neutrophils Relative % 83 (*)    Neutro Abs 10.1 (*)    Lymphocytes Relative 10 (*)    All other components within normal limits  HEPATIC FUNCTION PANEL - Abnormal; Notable for the following:    Total Protein 8.5 (*)    All other components within normal limits  LIPASE, BLOOD  URINALYSIS, ROUTINE W REFLEX MICROSCOPIC    Imaging Review No results found.   EKG Interpretation None      MDM   Final diagnoses:  Nausea vomiting and diarrhea  Alcohol abuse  Other headache syndrome    29 year old male with nausea vomiting diarrhea after heavy alcohol use.  He reports hematemesis.  Patient is hemodynamically stable, H&H normal.  No prior history of upper GI bleed or varices.  Patient describes heavy vomiting over the last 72 hours.  Blood in emesis, most likely secondary to Mallory-Weiss tear.  Patient is in no distress.  Suspect alcoholic gastritis.  He has a plan in place for detox.  At this time, will treat with Protonix.  Carafate.  GI cocktail and prescribed Carafate, Protonix and Zofran for home use    Olivia Mackielga M Ricki Clack, MD 10/03/14 [phone]

## 2014-10-03 NOTE — Discharge Instructions (Signed)
Take medications as prescribed.  You may take Tylenol for headache.  Please follow-up with Alcoholics Anonymous and/or local addiction specialist for help with your alcohol abuse problem.  Return to the emergency department for worsening condition or new concerning symptoms.    Alcohol Use Disorder Alcohol use disorder is a mental disorder. It is not a one-time incident of heavy drinking. Alcohol use disorder is the excessive and uncontrollable use of alcohol over time that leads to problems with functioning in one or more areas of daily living. People with this disorder risk harming themselves and others when they drink to excess. Alcohol use disorder also can cause other mental disorders, such as mood and anxiety disorders, and serious physical problems. People with alcohol use disorder often misuse other drugs.  Alcohol use disorder is common and widespread. Some people with this disorder drink alcohol to cope with or escape from negative life events. Others drink to relieve chronic pain or symptoms of mental illness. People with a family history of alcohol use disorder are at higher risk of losing control and using alcohol to excess.  SYMPTOMS  Signs and symptoms of alcohol use disorder may include the following:   Consumption ofalcohol inlarger amounts or over a longer period of time than intended.  Multiple unsuccessful attempts to cutdown or control alcohol use.   A great deal of time spent obtaining alcohol, using alcohol, or recovering from the effects of alcohol (hangover).  A strong desire or urge to use alcohol (cravings).   Continued use of alcohol despite problems at work, school, or home because of alcohol use.   Continued use of alcohol despite problems in relationships because of alcohol use.  Continued use of alcohol in situations when it is physically hazardous, such as driving a car.  Continued use of alcohol despite awareness of a physical or psychological problem  that is likely related to alcohol use. Physical problems related to alcohol use can involve the brain, heart, liver, stomach, and intestines. Psychological problems related to alcohol use include intoxication, depression, anxiety, psychosis, delirium, and dementia.   The need for increased amounts of alcohol to achieve the same desired effect, or a decreased effect from the consumption of the same amount of alcohol (tolerance).  Withdrawal symptoms upon reducing or stopping alcohol use, or alcohol use to reduce or avoid withdrawal symptoms. Withdrawal symptoms include:  Racing heart.  Hand tremor.  Difficulty sleeping.  Nausea.  Vomiting.  Hallucinations.  Restlessness.  Seizures. DIAGNOSIS Alcohol use disorder is diagnosed through an assessment by your health care provider. Your health care provider may start by asking three or four questions to screen for excessive or problematic alcohol use. To confirm a diagnosis of alcohol use disorder, at least two symptoms must be present within a 28-month period. The severity of alcohol use disorder depends on the number of symptoms:  Mild--two or three.  Moderate--four or five.  Severe--six or more. Your health care provider may perform a physical exam or use results from lab tests to see if you have physical problems resulting from alcohol use. Your health care provider may refer you to a mental health professional for evaluation. TREATMENT  Some people with alcohol use disorder are able to reduce their alcohol use to low-risk levels. Some people with alcohol use disorder need to quit drinking alcohol. When necessary, mental health professionals with specialized training in substance use treatment can help. Your health care provider can help you decide how severe your alcohol use disorder is and  what type of treatment you need. The following forms of treatment are available:   Detoxification. Detoxification involves the use of prescription  medicines to prevent alcohol withdrawal symptoms in the first week after quitting. This is important for people with a history of symptoms of withdrawal and for heavy drinkers who are likely to have withdrawal symptoms. Alcohol withdrawal can be dangerous and, in severe cases, cause death. Detoxification is usually provided in a hospital or in-patient substance use treatment facility.  Counseling or talk therapy. Talk therapy is provided by substance use treatment counselors. It addresses the reasons people use alcohol and ways to keep them from drinking again. The goals of talk therapy are to help people with alcohol use disorder find healthy activities and ways to cope with life stress, to identify and avoid triggers for alcohol use, and to handle cravings, which can cause relapse.  Medicines.Different medicines can help treat alcohol use disorder through the following actions:  Decrease alcohol cravings.  Decrease the positive reward response felt from alcohol use.  Produce an uncomfortable physical reaction when alcohol is used (aversion therapy).  Support groups. Support groups are run by people who have quit drinking. They provide emotional support, advice, and guidance. These forms of treatment are often combined. Some people with alcohol use disorder benefit from intensive combination treatment provided by specialized substance use treatment centers. Both inpatient and outpatient treatment programs are available. Document Released: 09/04/2004 Document Revised: 12/12/2013 Document Reviewed: 11/04/2012 [facility] Patient Information 2015 [place], [place]. This information is not intended to replace advice given to you by your health care provider. Make sure you discuss any questions you have with your health care provider.  Diarrhea Diarrhea is watery poop (stool). It can make you feel weak, tired, thirsty, or give you a dry mouth (signs of dehydration). Watery poop is a sign of another  problem, most often an infection. It often lasts 2-3 days. It can last longer if it is a sign of something serious. Take care of yourself as told by your doctor. HOME CARE   Drink 1 cup (8 ounces) of fluid each time you have watery poop.  Do not drink the following fluids:  Those that contain simple sugars (fructose, glucose, galactose, lactose, sucrose, maltose).  Sports drinks.  Fruit juices.  Whole milk products.  Sodas.  Drinks with caffeine (coffee, tea, soda) or alcohol.  Oral rehydration solution may be used if the doctor says it is okay. You may make your own solution. Follow this recipe:   - teaspoon table salt.   teaspoon baking soda.   teaspoon salt substitute containing potassium chloride.  1 tablespoons sugar.  1 liter (34 ounces) of water.  Avoid the following foods:  High fiber foods, such as raw fruits and vegetables.  Nuts, seeds, and whole grain breads and cereals.   Those that are sweetened with sugar alcohols (xylitol, sorbitol, mannitol).  Try eating the following foods:  Starchy foods, such as rice, toast, pasta, low-sugar cereal, oatmeal, baked potatoes, crackers, and bagels.  Bananas.  Applesauce.  Eat probiotic-rich foods, such as yogurt and milk products that are fermented.  Wash your hands well after each time you have watery poop.  Only take medicine as told by your doctor.  Take a warm bath to help lessen burning or pain from having watery poop. GET HELP RIGHT AWAY IF:   You cannot drink fluids without throwing up (vomiting).  You keep throwing up.  You have blood in your poop, or your poop  looks black and tarry.  You do not pee (urinate) in 6-8 hours, or there is only a small amount of very dark pee.  You have belly (abdominal) pain that gets worse or stays in the same spot (localizes).  You are weak, dizzy, confused, or light-headed.  You have a very bad headache.  Your watery poop gets worse or does not get  better.  You have a fever or lasting symptoms for more than 2-3 days.  You have a fever and your symptoms suddenly get worse. MAKE SURE YOU:   Understand these instructions.  Will watch your condition.  Will get help right away if you are not doing well or get worse. Document Released: 01/14/2008 Document Revised: 12/12/2013 Document Reviewed: 04/04/2012 [facility] Patient Information 2015 [place], [place]. This information is not intended to replace advice given to you by your health care provider. Make sure you discuss any questions you have with your health care provider.  Food Choices to Help Relieve Diarrhea When you have diarrhea, the foods you eat and your eating habits are very important. Choosing the right foods and drinks can help relieve diarrhea. Also, because diarrhea can last up to 7 days, you need to replace lost fluids and electrolytes (such as sodium, potassium, and chloride) in order to help prevent dehydration.  WHAT GENERAL GUIDELINES DO I NEED TO FOLLOW?  Slowly drink 1 cup (8 oz) of fluid for each episode of diarrhea. If you are getting enough fluid, your urine will be clear or pale yellow.  Eat starchy foods. Some good choices include white rice, white toast, pasta, low-fiber cereal, baked potatoes (without the skin), saltine crackers, and bagels.  Avoid large servings of any cooked vegetables.  Limit fruit to two servings per day. A serving is  cup or 1 small piece.  Choose foods with less than 2 g of fiber per serving.  Limit fats to less than 8 tsp (38 g) per day.  Avoid fried foods.  Eat foods that have probiotics in them. Probiotics can be found in certain dairy products.  Avoid foods and beverages that may increase the speed at which food moves through the stomach and intestines (gastrointestinal tract). Things to avoid include:  High-fiber foods, such as dried fruit, raw fruits and vegetables, nuts, seeds, and whole grain foods.  Spicy foods and  high-fat foods.  Foods and beverages sweetened with high-fructose corn syrup, honey, or sugar alcohols such as xylitol, sorbitol, and mannitol. WHAT FOODS ARE RECOMMENDED? Grains White rice. White, [place], or pita breads (fresh or toasted), including plain rolls, buns, or bagels. White pasta. Saltine, soda, or graham crackers. Pretzels. Low-fiber cereal. Cooked cereals made with water (such as cornmeal, farina, or cream cereals). Plain muffins. Matzo. Melba toast. Zwieback.  Vegetables Potatoes (without the skin). Strained tomato and vegetable juices. Most well-cooked and canned vegetables without seeds. Tender lettuce. Fruits Cooked or canned applesauce, apricots, cherries, fruit cocktail, grapefruit, peaches, pears, or plums. Fresh bananas, apples without skin, cherries, grapes, cantaloupe, grapefruit, peaches, oranges, or plums.  Meat and Other Protein Products Baked or boiled chicken. Eggs. Tofu. Fish. Seafood. Smooth peanut butter. Ground or well-cooked tender beef, ham, veal, lamb, pork, or poultry.  Dairy Plain yogurt, kefir, and unsweetened liquid yogurt. Lactose-free milk, buttermilk, or soy milk. Plain hard cheese. Beverages Sport drinks. Clear broths. Diluted fruit juices (except prune). Regular, caffeine-free sodas such as ginger ale. Water. Decaffeinated teas. Oral rehydration solutions. Sugar-free beverages not sweetened with sugar alcohols. Other Bouillon, broth, or soups made  from recommended foods.  The items listed above may not be a complete list of recommended foods or beverages. Contact your dietitian for more options. WHAT FOODS ARE NOT RECOMMENDED? Grains Whole grain, whole wheat, bran, or rye breads, rolls, pastas, crackers, and cereals. Wild or brown rice. Cereals that contain more than 2 g of fiber per serving. Corn tortillas or taco shells. Cooked or dry oatmeal. Granola. Popcorn. Vegetables Raw vegetables. Cabbage, broccoli, Brussels sprouts, artichokes, baked  beans, beet greens, corn, kale, legumes, peas, sweet potatoes, and yams. Potato skins. Cooked spinach and cabbage. Fruits Dried fruit, including raisins and dates. Raw fruits. Stewed or dried prunes. Fresh apples with skin, apricots, mangoes, pears, raspberries, and strawberries.  Meat and Other Protein Products Chunky peanut butter. Nuts and seeds. Beans and lentils. Tomasa BlaseBacon.  Dairy High-fat cheeses. Milk, chocolate milk, and beverages made with milk, such as milk shakes. Cream. Ice cream. Sweets and Desserts Sweet rolls, doughnuts, and sweet breads. Pancakes and waffles. Fats and Oils Butter. Cream sauces. Margarine. Salad oils. Plain salad dressings. Olives. Avocados.  Beverages Caffeinated beverages (such as coffee, tea, soda, or energy drinks). Alcoholic beverages. Fruit juices with pulp. Prune juice. Soft drinks sweetened with high-fructose corn syrup or sugar alcohols. Other Coconut. Hot sauce. [place] powder. Mayonnaise. Gravy. Cream-based or milk-based soups.  The items listed above may not be a complete list of foods and beverages to avoid. Contact your dietitian for more information. WHAT SHOULD I DO IF I BECOME DEHYDRATED? Diarrhea can sometimes lead to dehydration. Signs of dehydration include dark urine and dry mouth and skin. If you think you are dehydrated, you should rehydrate with an oral rehydration solution. These solutions can be purchased at pharmacies, retail stores, or online.  Drink -1 cup (120-240 mL) of oral rehydration solution each time you have an episode of diarrhea. If drinking this amount makes your diarrhea worse, try drinking smaller amounts more often. For example, drink 1-3 tsp (5-15 mL) every 5-10 minutes.  A general rule for staying hydrated is to drink 1-2 L of fluid per day. Talk to your health care provider about the specific amount you should be drinking each day. Drink enough fluids to keep your urine clear or pale yellow. Document Released: 10/18/2003  Document Revised: 08/02/2013 Document Reviewed: 06/20/2013 [facility] Patient Information 2015 [place], [place]. This information is not intended to replace advice given to you by your health care provider. Make sure you discuss any questions you have with your health care provider.  General Headache Without Cause A headache is pain or discomfort felt around the head or neck area. The specific cause of a headache may not be found. There are many causes and types of headaches. A few common ones are:  Tension headaches.  Migraine headaches.  Cluster headaches.  Chronic daily headaches. HOME CARE INSTRUCTIONS   Keep all follow-up appointments with your caregiver or any specialist referral.  Only take over-the-counter or prescription medicines for pain or discomfort as directed by your caregiver.  Lie down in a dark, quiet room when you have a headache.  Keep a headache journal to find out what may trigger your migraine headaches. For example, write down:  What you eat and drink.  How much sleep you get.  Any change to your diet or medicines.  Try massage or other relaxation techniques.  Put ice packs or heat on the head and neck. Use these 3 to 4 times per day for 15 to 20 minutes each time, or as needed.  Limit stress.  Sit up straight, and do not tense your muscles.  Quit smoking if you smoke.  Limit alcohol use.  Decrease the amount of caffeine you drink, or stop drinking caffeine.  Eat and sleep on a regular schedule.  Get 7 to 9 hours of sleep, or as recommended by your caregiver.  Keep lights dim if bright lights bother you and make your headaches worse. SEEK MEDICAL CARE IF:   You have problems with the medicines you were prescribed.  Your medicines are not working.  You have a change from the usual headache.  You have nausea or vomiting. SEEK IMMEDIATE MEDICAL CARE IF:   Your headache becomes severe.  You have a fever.  You have a stiff neck.  You  have loss of vision.  You have muscular weakness or loss of muscle control.  You start losing your balance or have trouble walking.  You feel faint or pass out.  You have severe symptoms that are different from your first symptoms. MAKE SURE YOU:   Understand these instructions.  Will watch your condition.  Will get help right away if you are not doing well or get worse. Document Released: 07/28/2005 Document Revised: 10/20/2011 Document Reviewed: 08/13/2011 [facility] Patient Information 2015 [place], [place]. This information is not intended to replace advice given to you by your health care provider. Make sure you discuss any questions you have with your health care provider.  Nausea and Vomiting Nausea is a sick feeling that often comes before throwing up (vomiting). Vomiting is a reflex where stomach contents come out of your mouth. Vomiting can cause severe loss of body fluids (dehydration). Children and elderly adults can become dehydrated quickly, especially if they also have diarrhea. Nausea and vomiting are symptoms of a condition or disease. It is important to find the cause of your symptoms. CAUSES   Direct irritation of the stomach lining. This irritation can result from increased acid production (gastroesophageal reflux disease), infection, food poisoning, taking certain medicines (such as nonsteroidal anti-inflammatory drugs), alcohol use, or tobacco use.  Signals from the brain.These signals could be caused by a headache, heat exposure, an inner ear disturbance, increased pressure in the brain from injury, infection, a tumor, or a concussion, pain, emotional stimulus, or metabolic problems.  An obstruction in the gastrointestinal tract (bowel obstruction).  Illnesses such as diabetes, hepatitis, gallbladder problems, appendicitis, kidney problems, cancer, sepsis, atypical symptoms of a heart attack, or eating disorders.  Medical treatments such as chemotherapy and  radiation.  Receiving medicine that makes you sleep (general anesthetic) during surgery. DIAGNOSIS Your caregiver may ask for tests to be done if the problems do not improve after a few days. Tests may also be done if symptoms are severe or if the reason for the nausea and vomiting is not clear. Tests may include:  Urine tests.  Blood tests.  Stool tests.  Cultures (to look for evidence of infection).  X-rays or other imaging studies. Test results can help your caregiver make decisions about treatment or the need for additional tests. TREATMENT You need to stay well hydrated. Drink frequently but in small amounts.You may wish to drink water, sports drinks, clear broth, or eat frozen ice pops or gelatin dessert to help stay hydrated.When you eat, eating slowly may help prevent nausea.There are also some antinausea medicines that may help prevent nausea. HOME CARE INSTRUCTIONS   Take all medicine as directed by your caregiver.  If you do not have an appetite, do not force  yourself to eat. However, you must continue to drink fluids.  If you have an appetite, eat a normal diet unless your caregiver tells you differently.  Eat a variety of complex carbohydrates (rice, wheat, potatoes, bread), lean meats, yogurt, fruits, and vegetables.  Avoid high-fat foods because they are more difficult to digest.  Drink enough water and fluids to keep your urine clear or pale yellow.  If you are dehydrated, ask your caregiver for specific rehydration instructions. Signs of dehydration may include:  Severe thirst.  Dry lips and mouth.  Dizziness.  Dark urine.  Decreasing urine frequency and amount.  Confusion.  Rapid breathing or pulse. SEEK IMMEDIATE MEDICAL CARE IF:   You have blood or brown flecks (like coffee grounds) in your vomit.  You have black or bloody stools.  You have a severe headache or stiff neck.  You are confused.  You have severe abdominal pain.  You have  chest pain or trouble breathing.  You do not urinate at least once every 8 hours.  You develop cold or clammy skin.  You continue to vomit for longer than 24 to 48 hours.  You have a fever. MAKE SURE YOU:   Understand these instructions.  Will watch your condition.  Will get help right away if you are not doing well or get worse. Document Released: 07/28/2005 Document Revised: 10/20/2011 Document Reviewed: 12/25/2010 [facility] Patient Information 2015 [place], [place]. This information is not intended to replace advice given to you by your health care provider. Make sure you discuss any questions you have with your health care provider.

## 2014-10-03 NOTE — ED Notes (Signed)
Patient tolerated PO fluids

## 2014-12-12 ENCOUNTER — Ambulatory Visit (INDEPENDENT_AMBULATORY_CARE_PROVIDER_SITE_OTHER): Payer: 59 | Admitting: Physician Assistant

## 2014-12-12 VITALS — BP 124/66 | HR 66 | Temp 98.0°F | Resp 16 | Ht 69.0 in | Wt 267.6 lb

## 2014-12-12 DIAGNOSIS — F329 Major depressive disorder, single episode, unspecified: Secondary | ICD-10-CM

## 2014-12-12 DIAGNOSIS — F32A Depression, unspecified: Secondary | ICD-10-CM

## 2014-12-12 DIAGNOSIS — K219 Gastro-esophageal reflux disease without esophagitis: Secondary | ICD-10-CM

## 2014-12-12 DIAGNOSIS — G44219 Episodic tension-type headache, not intractable: Secondary | ICD-10-CM

## 2014-12-12 LAB — POCT CBC
GRANULOCYTE PERCENT: 57.3 % (ref 37–80)
HCT, POC: 44.3 % (ref 43.5–53.7)
Hemoglobin: 14.1 g/dL (ref 14.1–18.1)
Lymph, poc: 2.8 (ref 0.6–3.4)
MCH, POC: 25 pg — AB (ref 27–31.2)
MCHC: 31.9 g/dL (ref 31.8–35.4)
MCV: 78.2 fL — AB (ref 80–97)
MID (cbc): 0.2 (ref 0–0.9)
MPV: 6.7 fL (ref 0–99.8)
POC GRANULOCYTE: 4.1 (ref 2–6.9)
POC LYMPH PERCENT: 39.4 %L (ref 10–50)
POC MID %: 3.3 %M (ref 0–12)
Platelet Count, POC: 332 10*3/uL (ref 142–424)
RBC: 5.66 M/uL (ref 4.69–6.13)
RDW, POC: 14.5 %
WBC: 7.1 10*3/uL (ref 4.6–10.2)

## 2014-12-12 MED ORDER — OMEPRAZOLE 20 MG PO CPDR: 20.0000 mg | DELAYED_RELEASE_CAPSULE | Freq: Every day | ORAL | Status: DC

## 2014-12-12 MED ORDER — FLUOXETINE HCL 20 MG PO CAPS: 20.0000 mg | ORAL_CAPSULE | Freq: Every day | ORAL | Status: DC

## 2014-12-12 MED ORDER — IBUPROFEN 200 MG PO CAPS: 400.0000 mg | ORAL_CAPSULE | Freq: Every day | ORAL | Status: DC | PRN
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
## 2014-12-13 DIAGNOSIS — F32A Depression, unspecified: Secondary | ICD-10-CM | POA: Insufficient documentation

## 2014-12-13 DIAGNOSIS — F329 Major depressive disorder, single episode, unspecified: Secondary | ICD-10-CM | POA: Insufficient documentation

## 2014-12-13 LAB — COMPREHENSIVE METABOLIC PANEL
ALK PHOS: 51 U/L (ref 39–117)
ALT: 21 U/L (ref 0–53)
AST: 17 U/L (ref 0–37)
Albumin: 4.4 g/dL (ref 3.5–5.2)
BUN: 9 mg/dL (ref 6–23)
CO2: 22 mEq/L (ref 19–32)
Calcium: 8.9 mg/dL (ref 8.4–10.5)
Chloride: 103 mEq/L (ref 96–112)
Creat: 0.86 mg/dL (ref 0.50–1.35)
Glucose, Bld: 119 mg/dL — ABNORMAL HIGH (ref 70–99)
POTASSIUM: 4.3 meq/L (ref 3.5–5.3)
SODIUM: 138 meq/L (ref 135–145)
TOTAL PROTEIN: 7.4 g/dL (ref 6.0–8.3)
Total Bilirubin: 0.2 mg/dL (ref 0.2–1.2)

## 2014-12-13 LAB — TSH: TSH: 2.667 u[IU]/mL (ref 0.350–4.500)

## 2014-12-14 LAB — H. PYLORI BREATH TEST: H. pylori Breath Test: NOT DETECTED

## 2015-01-07 ENCOUNTER — Ambulatory Visit (INDEPENDENT_AMBULATORY_CARE_PROVIDER_SITE_OTHER): Payer: 59

## 2015-01-07 ENCOUNTER — Ambulatory Visit (INDEPENDENT_AMBULATORY_CARE_PROVIDER_SITE_OTHER): Payer: 59 | Admitting: Family Medicine

## 2015-01-07 VITALS — BP 122/70 | HR 102 | Temp 101.6°F | Resp 18 | Ht 69.0 in | Wt 255.0 lb

## 2015-01-07 DIAGNOSIS — R05 Cough: Secondary | ICD-10-CM | POA: Diagnosis not present

## 2015-01-07 DIAGNOSIS — R059 Cough, unspecified: Secondary | ICD-10-CM

## 2015-01-07 DIAGNOSIS — R1110 Vomiting, unspecified: Secondary | ICD-10-CM | POA: Diagnosis not present

## 2015-01-07 DIAGNOSIS — R509 Fever, unspecified: Secondary | ICD-10-CM

## 2015-01-07 DIAGNOSIS — R197 Diarrhea, unspecified: Secondary | ICD-10-CM | POA: Diagnosis not present

## 2015-01-07 DIAGNOSIS — R809 Proteinuria, unspecified: Secondary | ICD-10-CM

## 2015-01-07 DIAGNOSIS — J029 Acute pharyngitis, unspecified: Secondary | ICD-10-CM

## 2015-01-07 LAB — POCT URINALYSIS DIPSTICK
Glucose, UA: NEGATIVE
Ketones, UA: 15
LEUKOCYTES UA: NEGATIVE
NITRITE UA: NEGATIVE
SPEC GRAV UA: 1.025
pH, UA: 6

## 2015-01-07 LAB — COMPREHENSIVE METABOLIC PANEL
ALT: 18 U/L (ref 0–53)
AST: 26 U/L (ref 0–37)
Albumin: 4.5 g/dL (ref 3.5–5.2)
Alkaline Phosphatase: 53 U/L (ref 39–117)
BUN: 8 mg/dL (ref 6–23)
CHLORIDE: 99 meq/L (ref 96–112)
CO2: 25 mEq/L (ref 19–32)
CREATININE: 0.99 mg/dL (ref 0.50–1.35)
Calcium: 9.2 mg/dL (ref 8.4–10.5)
Glucose, Bld: 107 mg/dL — ABNORMAL HIGH (ref 70–99)
Potassium: 3.7 mEq/L (ref 3.5–5.3)
Sodium: 134 mEq/L — ABNORMAL LOW (ref 135–145)
Total Bilirubin: 0.6 mg/dL (ref 0.2–1.2)
Total Protein: 7.7 g/dL (ref 6.0–8.3)

## 2015-01-07 LAB — POCT CBC
GRANULOCYTE PERCENT: 75.3 % (ref 37–80)
HCT, POC: 44.6 % (ref 43.5–53.7)
HEMOGLOBIN: 14.1 g/dL (ref 14.1–18.1)
LYMPH, POC: 2.4 (ref 0.6–3.4)
MCH: 24.5 pg — AB (ref 27–31.2)
MCHC: 31.7 g/dL — AB (ref 31.8–35.4)
MCV: 77.2 fL — AB (ref 80–97)
MID (CBC): 0.6 (ref 0–0.9)
MPV: 6.8 fL (ref 0–99.8)
PLATELET COUNT, POC: 273 10*3/uL (ref 142–424)
POC Granulocyte: 9 — AB (ref 2–6.9)
POC LYMPH %: 19.7 % (ref 10–50)
POC MID %: 5 %M (ref 0–12)
RBC: 5.78 M/uL (ref 4.69–6.13)
RDW, POC: 14.2 %
WBC: 12 10*3/uL — AB (ref 4.6–10.2)

## 2015-01-07 LAB — POCT INFLUENZA A/B
INFLUENZA A, POC: NEGATIVE
INFLUENZA B, POC: NEGATIVE

## 2015-01-07 LAB — POCT RAPID STREP A (OFFICE): Rapid Strep A Screen: NEGATIVE

## 2015-01-07 MED ORDER — DM-GUAIFENESIN ER 30-600 MG PO TB12: 1.0000 | ORAL_TABLET | Freq: Two times a day (BID) | ORAL | Status: DC

## 2015-01-07 MED ORDER — BENZONATATE 100 MG PO CAPS: 100.0000 mg | ORAL_CAPSULE | Freq: Three times a day (TID) | ORAL | Status: DC | PRN

## 2015-01-07 NOTE — Patient Instructions (Signed)

## 2015-01-07 NOTE — Progress Notes (Signed)
Subjective:    Patient ID: Anthony Mullen, male    DOB: [birth year], 29 y.o.   MRN: [id number]  01/07/2015  Sore Throat; Chills; Emesis; Dizziness; Diarrhea; and Fatigue   HPI This 29 y.o. male presents for evaluation of sore throat, fatigue, lightheadedness, vomiting, diarrhea, coughing, fever, headache.  +fever Tmax 101.6; taking Nyquil; took Delsym today.  +chills/sweats.  +body aches.  +HA +ST diffuse; minimal pain with swallowing.  No ear pain; +rhinorrhea; -nasal congestion; coughing a lot; no SOB; no sputum production.  +vomiting x 4-5 water; non-bloody non-bilious.  +diarrhea x 4 yesterday; non-bloody non-mucous.  +rectal bleeding separate issue bright red; s/p GI consultation; s/p colonoscopy with polypectomy 2014; repeat recommended at age 48; known hemorrhoids.  No recent travel.  No recent abx.  No camping.  No alcohol; last drink 3 months.   Taking Ricola.  No flu vaccine this season.  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  Review of Systems  Constitutional: Positive for fever, chills, diaphoresis and fatigue.  HENT: Positive for congestion, postnasal drip, rhinorrhea and sore throat. Negative for ear pain, sinus pressure, trouble swallowing and voice change.   Respiratory: Positive for cough. Negative for shortness of breath and wheezing.   Gastrointestinal: Positive for vomiting, abdominal pain and diarrhea. Negative for nausea.  Skin: Negative for rash.  Neurological: Positive for dizziness, light-headedness and headaches.    Past Medical History  Diagnosis Date  . Hypertension   . Substance abuse    Past Surgical History  Procedure Laterality Date  . Colonoscopy     Allergies  Allergen Reactions  . Benadryl [Diphenhydramine Hcl]    History   Social History  . Marital Status: Single    Spouse Name: N/A  . Number of Children: N/A  . Years of Education: N/A   Occupational History  . Not on file.   Social History Main Topics  . Smoking status: Never  Smoker   . Smokeless tobacco: Never Used  . Alcohol Use: Yes  . Drug Use: No  . Sexual Activity: Not on file   Other Topics Concern  . Not on file   Social History Narrative        Objective:    BP 122/70 mmHg  Pulse 102  Temp(Src) 101.6 F (38.7 C) (Oral)  Resp 18  Ht  (1.753 m)  Wt 255 lb (115.667 kg)  BMI 37.64 kg/m2  SpO2 97% Physical Exam  Constitutional: He is oriented to person, place, and time. He appears well-developed and well-nourished. No distress.  obese  HENT:  Head: Normocephalic and atraumatic.  Right Ear: Tympanic membrane, external ear and ear canal normal.  Left Ear: Tympanic membrane, external ear and ear canal normal.  Nose: Nose normal.  Mouth/Throat: Mucous membranes are normal. Posterior oropharyngeal erythema present. No oropharyngeal exudate, posterior oropharyngeal edema or tonsillar abscesses.  Eyes: Conjunctivae and EOM are normal. Pupils are equal, round, and reactive to light.  Neck: Normal range of motion. Neck supple. Carotid bruit is not present. No thyromegaly present.  Cardiovascular: Normal rate, regular rhythm, normal heart sounds and intact distal pulses.  Exam reveals no gallop and no friction rub.   No murmur heard. Pulmonary/Chest: Effort normal and breath sounds normal. He has no wheezes. He has no rales.  Abdominal: Soft. Bowel sounds are normal. He exhibits no distension. There is tenderness in the epigastric area. There is no rebound and no guarding.  Lymphadenopathy:    He has no cervical adenopathy.  Neurological:  He is alert and oriented to person, place, and time. No cranial nerve deficit.  Skin: Skin is warm and dry. No rash noted. He is not diaphoretic.  Psychiatric: He has a normal mood and affect. His behavior is normal.  Nursing note and vitals reviewed.  Results for orders placed or performed in visit on 01/07/15  POCT Influenza A/B  Result Value Ref Range   Influenza A, POC Negative    Influenza B, POC  Negative   POCT rapid strep A  Result Value Ref Range   Rapid Strep A Screen Negative Negative  POCT urinalysis dipstick  Result Value Ref Range   Color, UA amber    Clarity, UA clear    Glucose, UA neg    Bilirubin, UA small    Ketones, UA 15    Spec Grav, UA 1.025    Blood, UA trace    pH, UA 6.0    Protein, UA >=300    Urobilinogen, UA >=8.0    Nitrite, UA neg    Leukocytes, UA Negative   POCT CBC  Result Value Ref Range   WBC 12.0 (A) 4.6 - 10.2 K/uL   Lymph, poc 2.4 0.6 - 3.4   POC LYMPH PERCENT 19.7 10 - 50 %L   MID (cbc) 0.6 0 - 0.9   POC MID % 5.0 0 - 12 %M   POC Granulocyte 9.0 (A) 2 - 6.9   Granulocyte percent 75.3 37 - 80 %G   RBC 5.78 4.69 - 6.13 M/uL   Hemoglobin 14.1 14.1 - 18.1 g/dL   HCT, POC 16.144.6 09.643.5 - 53.7 %   MCV 77.2 (A) 80 - 97 fL   MCH, POC 24.5 (A) 27 - 31.2 pg   MCHC 31.7 (A) 31.8 - 35.4 g/dL   RDW, POC 04.514.2 %   Platelet Count, POC 273 142 - 424 K/uL   MPV 6.8 0 - 99.8 fL   TYLENOL 500MG  TWO TABLETS PO ADMINISTERED IN OFFICE.  [facility] reading (PRIMARY) by  Dr. Katrinka BlazingSmith. CXR: NAD  Orthostatic VS for the past 24 hrs:  BP- Lying Pulse- Lying BP- Sitting Pulse- Sitting BP- Standing at 0 minutes Pulse- Standing at 0 minutes  01/07/15 1201 144/78 mmHg 90 135/81 mmHg 92 120/78 mmHg 98        Assessment & Plan:   1. Other specified fever   2. Sore throat   3. Vomiting and diarrhea   4. Cough   5. Proteinuria    1. URI: New.  Consistent with fever, cough, sore throat; send throat culture; treat supportively with rest, fluids, Ibuprofen. Rx for Mucinex DM bid and Tessalon Perles tid PRN.  RTC for acute worsening. 2.  Gastroenteritis: New. Recommend BRAT diet, hydration.  RTC inability to keep down fluids.  RTC 72 hours if no improvement; will warrant stool studies if persists.  Pt declined antiemetic. 3.  Proteinuria: New. Likely due to dehydration; recommend repeat u/a at follow-up visit.   Meds ordered this encounter  Medications  .  dextromethorphan-guaiFENesin (MUCINEX DM) 30-600 MG per 12 hr tablet    Sig: Take 1 tablet by mouth 2 (two) times daily.    Dispense:  20 tablet    Refill:  0  . benzonatate (TESSALON) 100 MG capsule    Sig: Take 1-2 capsules (100-200 mg total) by mouth 3 (three) times daily as needed for cough.    Dispense:  40 capsule    Refill:  0    No Follow-up on file.  Romir Klimowicz Elayne Guerin, M.D. [facility] [address] [place], [place]  [address] [phone] phone [phone] fax

## 2015-01-09 LAB — CULTURE, GROUP A STREP: ORGANISM ID, BACTERIA: NORMAL

## 2015-01-10 ENCOUNTER — Emergency Department (HOSPITAL_COMMUNITY)
Admission: EM | Admit: 2015-01-10 | Discharge: 2015-01-10 | Disposition: A | Discharge status: Home / Self Care | Payer: 59 | Source: Home / Self Care | Attending: Family Medicine | Admitting: Family Medicine

## 2015-01-10 ENCOUNTER — Encounter (HOSPITAL_COMMUNITY): Payer: Self-pay | Admitting: *Deleted

## 2015-01-10 DIAGNOSIS — J189 Pneumonia, unspecified organism: Secondary | ICD-10-CM | POA: Diagnosis not present

## 2015-01-10 MED ORDER — LEVOFLOXACIN 500 MG PO TABS: 500.0000 mg | ORAL_TABLET | Freq: Every day | ORAL | Status: DC

## 2015-01-10 NOTE — ED Notes (Signed)
Pt  States  He    Has  Been  Sick  For  About  1  Week     He  States  It  Started  Out  As   sorethroat   With    dizzyness      Fever   He  yhen  Developed   Some  Diarrhea        Some  Chills  Also  And           Nausea   And  Vomiting       he  States   He  Was  [facility]  [facility]  [facility]  And  Had  A   Work  Up       With  Tests  And was  Diagnosed  With a  Virus  meds  Not  Helping      Pt  Having  Chills

## 2015-01-10 NOTE — ED Provider Notes (Signed)
Anthony Mullen is a 29 y.o. male who presents to [facility] today for cough congestion headache night sweats nausea and vomiting. Patient has been sick now for 7 days. [facility] where multiple different tests were obtained.  White blood cell count was elevated at 12. Chest x-ray was done at the time and read by the physician as normal however subsequent read by radiology shows a community-acquired pneumonia. He was not treated with anti-biotics. He remains symptomatic. He feels a bit better than he did a few days ago.   Past Medical History  Diagnosis Date  . Hypertension   . Substance abuse    Past Surgical History  Procedure Laterality Date  . Colonoscopy     History  Substance Use Topics  . Smoking status: Never Smoker   . Smokeless tobacco: Never Used  . Alcohol Use: Yes   ROS as above Medications: No current facility-administered medications for this encounter.   Current Outpatient Prescriptions  Medication Sig Dispense Refill  . benzonatate (TESSALON) 100 MG capsule Take 1-2 capsules (100-200 mg total) by mouth 3 (three) times daily as needed for cough. 40 capsule 0  . dextromethorphan-guaiFENesin (MUCINEX DM) 30-600 MG per 12 hr tablet Take 1 tablet by mouth 2 (two) times daily. 20 tablet 0  . levofloxacin (LEVAQUIN) 500 MG tablet Take 1 tablet (500 mg total) by mouth daily. 10 tablet 0  . [DISCONTINUED] FLUoxetine (PROZAC) 20 MG capsule Take 1 capsule (20 mg total) by mouth daily. (Patient not taking: Reported on 01/07/2015) 30 capsule 3  . [DISCONTINUED] omeprazole (PRILOSEC) 20 MG capsule Take 1 capsule (20 mg total) by mouth daily. (Patient not taking: Reported on 01/07/2015) 30 capsule 3   Allergies  Allergen Reactions  . Benadryl [Diphenhydramine Hcl]      Exam:  BP 130/86 mmHg  Pulse 92  Temp(Src) 99.5 F (37.5 C) (Oral)  Resp 16  SpO2 96% Gen: Well NAD HEENT: EOMI,  MMM normal posterior pharynx and tympanic  membranes Lungs: Normal work of breathing. CTABL Heart: RRR no MRG Abd: NABS, Soft. Nondistended, Nontender Exts: Brisk capillary refill, warm and well perfused.   Review of chest x-ray from 01/07/2015 shows left lower pneumonia  No results found for this or any previous visit (from the past 24 hour(s)). No results found.  Assessment and Plan: 29 y.o. male with community-acquired pneumonia treat with Levaquin. Follow-up with PCP.  Discussed warning signs or symptoms. Please see discharge instructions. Patient expresses understanding.     Rodolph BongEvan S Lachell Rochette, MD 01/10/15 1710

## 2015-01-10 NOTE — Discharge Instructions (Signed)
Thank you for coming in today. Take levaquin daily.  Follow up with your doctor as needed.  Call or go to the emergency room if you get worse, have trouble breathing, have chest pains, or palpitations.   Pneumonia Pneumonia is an infection of the lungs.  CAUSES Pneumonia may be caused by bacteria or a virus. Usually, these infections are caused by breathing infectious particles into the lungs (respiratory tract). SIGNS AND SYMPTOMS   Cough.  Fever.  Chest pain.  Increased rate of breathing.  Wheezing.  Mucus production. DIAGNOSIS  If you have the common symptoms of pneumonia, your health care provider will typically confirm the diagnosis with a chest X-ray. The X-ray will show an abnormality in the lung (pulmonary infiltrate) if you have pneumonia. Other tests of your blood, urine, or sputum may be done to find the specific cause of your pneumonia. Your health care provider may also do tests (blood gases or pulse oximetry) to see how well your lungs are working. TREATMENT  Some forms of pneumonia may be spread to other people when you cough or sneeze. You may be asked to wear a mask before and during your exam. Pneumonia that is caused by bacteria is treated with antibiotic medicine. Pneumonia that is caused by the influenza virus may be treated with an antiviral medicine. Most other viral infections must run their course. These infections will not respond to antibiotics.  HOME CARE INSTRUCTIONS   Cough suppressants may be used if you are losing too much rest. However, coughing protects you by clearing your lungs. You should avoid using cough suppressants if you can.  Your health care provider may have prescribed medicine if he or she thinks your pneumonia is caused by bacteria or influenza. Finish your medicine even if you start to feel better.  Your health care provider may also prescribe an expectorant. This loosens the mucus to be coughed up.  Take medicines only as directed by  your health care provider.  Do not smoke. Smoking is a common cause of bronchitis and can contribute to pneumonia. If you are a smoker and continue to smoke, your cough may last several weeks after your pneumonia has cleared.  A cold steam vaporizer or humidifier in your room or home may help loosen mucus.  Coughing is often worse at night. Sleeping in a semi-upright position in a recliner or using a couple pillows under your head will help with this.  Get rest as you feel it is needed. Your body will usually let you know when you need to rest. PREVENTION A pneumococcal shot (vaccine) is available to prevent a common bacterial cause of pneumonia. This is usually suggested for:  People over 573 years old.  Patients on chemotherapy.  People with chronic lung problems, such as bronchitis or emphysema.  People with immune system problems. If you are over 65 or have a high risk condition, you may receive the pneumococcal vaccine if you have not received it before. In some countries, a routine influenza vaccine is also recommended. This vaccine can help prevent some cases of pneumonia.You may be offered the influenza vaccine as part of your care. If you smoke, it is time to quit. You may receive instructions on how to stop smoking. Your health care provider can provide medicines and counseling to help you quit. SEEK MEDICAL CARE IF: You have a fever. SEEK IMMEDIATE MEDICAL CARE IF:   Your illness becomes worse. This is especially true if you are elderly or weakened from  any other disease.  You cannot control your cough with suppressants and are losing sleep.  You begin coughing up blood.  You develop pain which is getting worse or is uncontrolled with medicines.  Any of the symptoms which initially brought you in for treatment are getting worse rather than better.  You develop shortness of breath or chest pain. MAKE SURE YOU:   Understand these instructions.  Will watch your  condition.  Will get help right away if you are not doing well or get worse. Document Released: 07/28/2005 Document Revised: 12/12/2013 Document Reviewed: 10/17/2010 [facility] Patient Information 2015 [place], [place]. This information is not intended to replace advice given to you by your health care provider. Make sure you discuss any questions you have with your health care provider.

## 2015-01-29 ENCOUNTER — Ambulatory Visit: Payer: 59 | Admitting: Physician Assistant

## 2015-10-09 ENCOUNTER — Encounter (HOSPITAL_COMMUNITY): Payer: Self-pay

## 2015-10-09 ENCOUNTER — Emergency Department (HOSPITAL_COMMUNITY)
Admission: EM | Admit: 2015-10-09 | Discharge: 2015-10-09 | Disposition: A | Discharge status: Home / Self Care | Payer: BC Managed Care – PPO | Attending: Emergency Medicine | Admitting: Emergency Medicine

## 2015-10-09 DIAGNOSIS — R0789 Other chest pain: Secondary | ICD-10-CM | POA: Diagnosis not present

## 2015-10-09 DIAGNOSIS — R079 Chest pain, unspecified: Secondary | ICD-10-CM | POA: Diagnosis present

## 2015-10-09 DIAGNOSIS — I10 Essential (primary) hypertension: Secondary | ICD-10-CM | POA: Insufficient documentation

## 2015-10-09 LAB — I-STAT CHEM 8, ED
BUN: 16 mg/dL (ref 6–20)
CHLORIDE: 104 mmol/L (ref 101–111)
Calcium, Ion: 1.21 mmol/L (ref 1.12–1.23)
Creatinine, Ser: 0.8 mg/dL (ref 0.61–1.24)
GLUCOSE: 94 mg/dL (ref 65–99)
HCT: 49 % (ref 39.0–52.0)
Hemoglobin: 16.7 g/dL (ref 13.0–17.0)
Potassium: 4.5 mmol/L (ref 3.5–5.1)
Sodium: 141 mmol/L (ref 135–145)
TCO2: 27 mmol/L (ref 0–100)

## 2015-10-09 LAB — CBC
HCT: 45.7 % (ref 39.0–52.0)
HEMOGLOBIN: 14.8 g/dL (ref 13.0–17.0)
MCH: 26.5 pg (ref 26.0–34.0)
MCHC: 32.4 g/dL (ref 30.0–36.0)
MCV: 81.8 fL (ref 78.0–100.0)
PLATELETS: 270 10*3/uL (ref 150–400)
RBC: 5.59 MIL/uL (ref 4.22–5.81)
RDW: 14.2 % (ref 11.5–15.5)
WBC: 5.9 10*3/uL (ref 4.0–10.5)

## 2015-10-09 LAB — I-STAT TROPONIN, ED: TROPONIN I, POC: 0 ng/mL (ref 0.00–0.08)

## 2015-10-09 LAB — CK TOTAL AND CKMB (NOT AT ARMC)
CK, MB: 1.4 ng/mL (ref 0.5–5.0)
Relative Index: 0.5 (ref 0.0–2.5)
Total CK: 292 U/L (ref 49–397)

## 2015-10-09 MED ORDER — IBUPROFEN 800 MG PO TABS: 800.0000 mg | ORAL_TABLET | Freq: Three times a day (TID) | ORAL | Status: DC

## 2015-10-09 NOTE — Discharge Instructions (Signed)
Chest Wall Pain °Chest wall pain is pain in or around the bones and muscles of your chest. Sometimes, an injury causes this pain. Sometimes, the cause may not be known. This pain may take several weeks or longer to get better. °HOME CARE INSTRUCTIONS  °Pay attention to any changes in your symptoms. Take these actions to help with your pain:  °· Rest as told by your health care provider.   °· Avoid activities that cause pain. These include any activities that use your chest muscles or your abdominal and side muscles to lift heavy items.    °· If directed, apply ice to the painful area: °· Put ice in a plastic bag. °· Place a towel between your skin and the bag. °· Leave the ice on for 20 minutes, 2-3 times per day. °· Take over-the-counter and prescription medicines only as told by your health care provider. °· Do not use tobacco products, including cigarettes, chewing tobacco, and e-cigarettes. If you need help quitting, ask your health care provider. °· Keep all follow-up visits as told by your health care provider. This is important. °SEEK MEDICAL CARE IF: °· You have a fever. °· Your chest pain becomes worse. °· You have new symptoms. °SEEK IMMEDIATE MEDICAL CARE IF: °· You have nausea or vomiting. °· You feel sweaty or light-headed. °· You have a cough with phlegm (sputum) or you cough up blood. °· You develop shortness of breath. °  °This information is not intended to replace advice given to you by your health care provider. Make sure you discuss any questions you have with your health care provider. °  °Document Released: 07/28/2005 Document Revised: 04/18/2015 Document Reviewed: 10/23/2014 °Elsevier Interactive Patient Education ©2016 [employer] °Heat Therapy °Heat therapy can help ease sore, stiff, injured, and tight muscles and joints. Heat relaxes your muscles, which may help ease your pain.  °RISKS AND COMPLICATIONS °If you have any of the following conditions, do not use heat therapy unless your  health care provider has approved: °· Poor circulation. °· Healing wounds or scarred skin in the area being treated. °· Diabetes, heart disease, or high blood pressure. °· Not being able to feel (numbness) the area being treated. °· Unusual swelling of the area being treated. °· Active infections. °· Blood clots. °· Cancer. °· Inability to communicate pain. This may include young children and people who have problems with their brain function (dementia). °· Pregnancy. °Heat therapy should only be used on old, pre-existing, or long-lasting (chronic) injuries. Do not use heat therapy on new injuries unless directed by your health care provider. °HOW TO USE HEAT THERAPY °There are several different kinds of heat therapy, including: °· Moist heat pack. °· Warm water bath. °· Hot water bottle. °· Electric heating pad. °· Heated gel pack. °· Heated wrap. °· Electric heating pad. °Use the heat therapy method suggested by your health care provider. Follow your health care provider's instructions on when and how to use heat therapy. °GENERAL HEAT THERAPY RECOMMENDATIONS °· Do not sleep while using heat therapy. Only use heat therapy while you are awake. °· Your skin may turn pink while using heat therapy. Do not use heat therapy if your skin turns red. °· Do not use heat therapy if you have new pain. °· High heat or long exposure to heat can cause burns. Be careful when using heat therapy to avoid burning your skin. °· Do not use heat therapy on areas of your skin that are already irritated, such as with a   rash or sunburn. °SEEK MEDICAL CARE IF: °· You have blisters, redness, swelling, or numbness. °· You have new pain. °· Your pain is worse. °MAKE SURE YOU: °· Understand these instructions. °· Will watch your condition. °· Will get help right away if you are not doing well or get worse. °  °This information is not intended to replace advice given to you by your health care provider. Make sure you discuss any questions you  have with your health care provider. °  °Document Released: 10/20/2011 Document Revised: 08/18/2014 Document Reviewed: 09/20/2013 °Elsevier Interactive Patient Education ©2016 Elsevier Inc. ° °

## 2015-10-09 NOTE — ED Notes (Signed)
Pt took a nap and woke up with left sided chest pain, he describes it as sharp and tight that radiates down to his hand, he states that he works out pretty hard and thinks it may be muscular

## 2015-10-09 NOTE — ED Provider Notes (Signed)
CSN: [id number]     Arrival date & time 10/09/15  1940 History  By signing my name below, I, Anthony Mullen, attest that this documentation has been prepared under the direction and in the presence of Major Santerre PA-C. Electronically Signed: Bethel Mullen, ED Scribe. 10/09/2015 10:24 PM     Chief Complaint  Patient presents with  . Chest Pain    The history is provided by the patient. No language interpreter was used.   Anthony Mullen is a 30 y.o. male with history of HTN and substance abuse who presents to the Emergency Department complaining of sharp, 6/10 in severity,  left sided chest pain with sudden onset today after a nap. Pt notes that he works out frequently and started lifting heavier 2 days ago. This pain is not exacerbated by deep breathing.  Pt states that he had a similar episode a couple years ago that was more severe. After that episode he was evaluated and had a negative stress test. Pt denies SOB, cough, and fever.  He does not smoke. Pt states that he has a PCP but does not know his name.   Past Medical History  Diagnosis Date  . Hypertension   . Substance abuse    Past Surgical History  Procedure Laterality Date  . Colonoscopy     Family History  Problem Relation Age of Onset  . Heart failure      cousin/grandfather  . Breast cancer      male cousin/grandmother  . Heart attack      grandfather  . Hypertension Mother   . Hypertension Maternal Grandmother   . Cancer Paternal Grandmother    Social History  Substance Use Topics  . Smoking status: Never Smoker   . Smokeless tobacco: Never Used  . Alcohol Use: Yes    Review of Systems  Constitutional: Negative for fever.  Respiratory: Negative for cough and shortness of breath.   Cardiovascular: Positive for chest pain.    Allergies  Benadryl  Home Medications   Prior to Admission medications   Medication Sig Start Date End Date Taking? Authorizing Provider  ibuprofen (ADVIL,MOTRIN) 800 MG  tablet Take 1 tablet (800 mg total) by mouth 3 (three) times daily. 10/09/15   Adelard Sanon, PA-C   BP 128/75 mmHg  Pulse 69  Temp(Src) 98.3 F (36.8 C) (Oral)  Resp 20  SpO2 99% Physical Exam  Constitutional: He is oriented to person, place, and time. He appears well-developed and well-nourished. No distress.  HENT:  Head: Normocephalic and atraumatic.  Eyes: Conjunctivae and EOM are normal.  Neck: Neck supple. No tracheal deviation present.  Cardiovascular: Normal rate, regular rhythm and intact distal pulses.   No murmur heard. Pulmonary/Chest: Effort normal. No respiratory distress.  CTAB  Musculoskeletal: Normal range of motion.  FROM of bilateral upper extremities with reproducible chest pain on movement of the left arm   Neurological: He is alert and oriented to person, place, and time.  Equal grip strength at bilateral upper extremities   Skin: Skin is warm and dry.  Psychiatric: He has a normal mood and affect. His behavior is normal.  Nursing note and vitals reviewed.   ED Course  Procedures (including critical care time) DIAGNOSTIC STUDIES: Oxygen Saturation is 99% on RA,  normal by my interpretation.    COORDINATION OF CARE: 10:19 PM Discussed treatment plan which includes lab work and EKG with pt at bedside and pt agreed to plan.  Labs Review Labs Reviewed  CBC  CK  TOTAL AND CKMB (NOT AT [facility])  I-STAT CHEM 8, ED  I-STAT TROPOININ, ED   Results for orders placed or performed during the hospital encounter of 10/09/15  CBC  Result Value Ref Range   WBC 5.9 4.0 - 10.5 K/uL   RBC 5.59 4.22 - 5.81 MIL/uL   Hemoglobin 14.8 13.0 - 17.0 g/dL   HCT 09.8 11.9 - 14.7 %   MCV 81.8 78.0 - 100.0 fL   MCH 26.5 26.0 - 34.0 pg   MCHC 32.4 30.0 - 36.0 g/dL   RDW 82.9 56.2 - 13.0 %   Platelets 270 150 - 400 K/uL  I-Stat Chem 8, ED  (not at [facility])  Result Value Ref Range   Sodium 141 135 - 145 mmol/L   Potassium 4.5 3.5 - 5.1 mmol/L   Chloride 104 101 - 111  mmol/L   BUN 16 6 - 20 mg/dL   Creatinine, Ser 8.65 0.61 - 1.24 mg/dL   Glucose, Bld 94 65 - 99 mg/dL   Calcium, Ion 7.84 6.96 - 1.23 mmol/L   TCO2 27 0 - 100 mmol/L   Hemoglobin 16.7 13.0 - 17.0 g/dL   HCT 29.5 28.4 - 13.2 %  I-stat troponin, ED (not at [facility])  Result Value Ref Range   Troponin i, poc 0.00 0.00 - 0.08 ng/mL   Comment 3            Imaging Review No results found. I have personally reviewed and evaluated these images and lab results as part of my medical decision-making.   EKG Interpretation   Date/Time:  Tuesday October 09 2015 20:18:40 EST Ventricular Rate:  70 PR Interval:  161 QRS Duration: 81 QT Interval:  391 QTC Calculation: 422 R Axis:   70 Text Interpretation:  Sinus rhythm ST elev, probable normal early repol  pattern agree. no change from old Confirmed by Donnald Garre, MD, Lebron Conners [phone])  on 10/09/2015 8:24:57 PM      MDM   Final diagnoses:  Chest wall pain    Labs and EKG reviewed and found to be within normal limits. Pain is worse with movement, better with rest, supporting diagnosis of muscular pain. The patient reports increasing weights on his weight lifting routine 2 days ago, further corroborating diagnosis.   I personally performed the services described in this documentation, which was scribed in my presence. The recorded information has been reviewed and is accurate.     Elpidio Anis, PA-C 10/09/15 2240  Arby Barrette, MD 10/10/15 [phone]

## 2015-11-13 ENCOUNTER — Ambulatory Visit
Admission: RE | Admit: 2015-11-13 | Discharge: 2015-11-13 | Disposition: A | Discharge status: Home / Self Care | Payer: No Typology Code available for payment source | Source: Ambulatory Visit | Attending: Occupational Medicine | Admitting: Occupational Medicine

## 2015-11-13 ENCOUNTER — Other Ambulatory Visit: Payer: Self-pay | Admitting: Occupational Medicine

## 2015-11-13 DIAGNOSIS — Z021 Encounter for pre-employment examination: Secondary | ICD-10-CM

## 2016-05-13 ENCOUNTER — Encounter (HOSPITAL_COMMUNITY): Payer: Self-pay

## 2016-05-13 ENCOUNTER — Emergency Department (HOSPITAL_COMMUNITY)
Admission: EM | Admit: 2016-05-13 | Discharge: 2016-05-13 | Disposition: A | Discharge status: Home / Self Care | Payer: 59 | Attending: Emergency Medicine | Admitting: Emergency Medicine

## 2016-05-13 DIAGNOSIS — I10 Essential (primary) hypertension: Secondary | ICD-10-CM | POA: Diagnosis not present

## 2016-05-13 DIAGNOSIS — K625 Hemorrhage of anus and rectum: Secondary | ICD-10-CM | POA: Insufficient documentation

## 2016-05-13 HISTORY — DX: Unspecified hemorrhoids: K64.9

## 2016-05-13 HISTORY — DX: Unspecified abdominal hernia without obstruction or gangrene: K46.9

## 2016-05-13 LAB — COMPREHENSIVE METABOLIC PANEL
ALT: 18 U/L (ref 17–63)
AST: 23 U/L (ref 15–41)
Albumin: 4.4 g/dL (ref 3.5–5.0)
Alkaline Phosphatase: 49 U/L (ref 38–126)
Anion gap: 6 (ref 5–15)
BUN: 10 mg/dL (ref 6–20)
CHLORIDE: 105 mmol/L (ref 101–111)
CO2: 28 mmol/L (ref 22–32)
CREATININE: 0.99 mg/dL (ref 0.61–1.24)
Calcium: 9.5 mg/dL (ref 8.9–10.3)
GFR calc Af Amer: >60 mL/min (ref >60)
GLUCOSE: 91 mg/dL (ref 65–99)
Potassium: 3.9 mmol/L (ref 3.5–5.1)
SODIUM: 139 mmol/L (ref 135–145)
Total Bilirubin: 0.9 mg/dL (ref 0.3–1.2)
Total Protein: 7.6 g/dL (ref 6.5–8.1)

## 2016-05-13 LAB — CBC
HEMATOCRIT: 41.9 % (ref 39.0–52.0)
HEMOGLOBIN: 13.8 g/dL (ref 13.0–17.0)
MCH: 26.5 pg (ref 26.0–34.0)
MCHC: 32.9 g/dL (ref 30.0–36.0)
MCV: 80.6 fL (ref 78.0–100.0)
Platelets: 280 10*3/uL (ref 150–400)
RBC: 5.2 MIL/uL (ref 4.22–5.81)
RDW: 13.7 % (ref 11.5–15.5)
WBC: 6.9 10*3/uL (ref 4.0–10.5)

## 2016-05-13 LAB — TYPE AND SCREEN
ABO/RH(D): A POS
Antibody Screen: NEGATIVE

## 2016-05-13 LAB — ABO/RH: ABO/RH(D): A POS

## 2016-05-13 LAB — POC OCCULT BLOOD, ED: Fecal Occult Bld: NEGATIVE

## 2016-05-13 NOTE — ED Provider Notes (Addendum)
[facility]-EMERGENCY DEPT Provider Note   CSN: [id number] Arrival date & time: 05/13/16  1235     History   Chief Complaint Chief Complaint  Patient presents with  . Rectal Bleeding  . Dizziness    HPI Anthony Mullen is a 30 y.o. male.Complains of rectal bleeding for one month. Patient states that today he had 2 bowel movements that were your blood. He admits to mild lightheadedness, worse with "moving around" or standing. Improved with remaining still. Denies pain anywhere. Presently hungry. No other associated symptoms. Patient reports he had colonoscopy 2 years ago which showed polyps.  HPI  Past Medical History:  Diagnosis Date  . Hemorrhoids   . Hernia, abdominal   . Hypertension   . Substance abuse     Patient Active Problem List   Diagnosis Date Noted  . Depression (emotion) 12/13/2014    Past Surgical History:  Procedure Laterality Date  . COLONOSCOPY         Home Medications    Prior to Admission medications   Medication Sig Start Date End Date Taking? Authorizing Provider  ibuprofen (ADVIL,MOTRIN) 800 MG tablet Take 1 tablet (800 mg total) by mouth 3 (three) times daily. Patient not taking: Reported on 05/13/2016 10/09/15   Elpidio Anis, PA-C    Family History Family History  Problem Relation Age of Onset  . Hypertension Mother   . Hypertension Maternal Grandmother   . Cancer Paternal Grandmother   . Heart failure      cousin/grandfather  . Breast cancer      male cousin/grandmother  . Heart attack      grandfather    Social History Social History  Substance Use Topics  . Smoking status: Never Smoker  . Smokeless tobacco: Never Used  . Alcohol use Yes  Positive smoker occasional alcohol use noted illicit drug use   Allergies   Benadryl [diphenhydramine hcl]   Review of Systems Review of Systems  Constitutional: Negative.   HENT: Negative.   Respiratory: Negative.   Cardiovascular: Positive for chest pain.       Syncope    Gastrointestinal: Positive for blood in stool.  Musculoskeletal: Negative.   Skin: Negative.   Allergic/Immunologic: Negative.   Neurological: Positive for light-headedness.  Psychiatric/Behavioral: Negative.   All other systems reviewed and are negative.    Physical Exam Updated Vital Signs BP 143/59 (BP Location: Right Arm)   Pulse 69   Temp 98.4 F (36.9 C) (Oral)   Resp 18   Ht 5\' 9"  (1.753 m)   Wt 242 lb 8.1 oz (110 kg)   SpO2 95%   BMI 35.81 kg/m   Physical Exam  Constitutional: He is oriented to person, place, and time. He appears well-developed and well-nourished. No distress.  HENT:  Head: Normocephalic and atraumatic.  Eyes: Conjunctivae are normal. Pupils are equal, round, and reactive to light.  Neck: Neck supple. No tracheal deviation present. No thyromegaly present.  Cardiovascular: Normal rate and regular rhythm.   No murmur heard. Pulmonary/Chest: Effort normal and breath sounds normal.  Abdominal: Soft. Bowel sounds are normal. He exhibits no distension. There is no tenderness.  Genitourinary: Rectum normal. Rectal exam shows guaiac negative stool.  Genitourinary Comments: Brown stool Hemoccult negative  Musculoskeletal: Normal range of motion. He exhibits no edema or tenderness.  Neurological: He is alert and oriented to person, place, and time. Coordination normal.  Gait normal  Skin: Skin is warm and dry. No rash noted.  Psychiatric: He has a normal mood and  affect.  Nursing note and vitals reviewed.  7:40 PM patient asymptomatic. It a meal in the emergency department. He is no evidence of rectal bleeding on exam. Hemodynamically stable.  ED Treatments / Results  Labs (all labs ordered are listed, but only abnormal results are displayed) Labs Reviewed  COMPREHENSIVE METABOLIC PANEL  CBC  POC OCCULT BLOOD, ED  TYPE AND SCREEN  ABO/RH    EKG  EKG Interpretation None       Radiology No results found.  Procedures Procedures  (including critical care time)  Medications Ordered in ED Medications - No data to display   Initial Impression / Assessment and Plan / ED Course  I have reviewed the triage vital signs and the nursing notes.  Pertinent labs & imaging results that were available during my care of the patient were reviewed by me and considered in my medical decision making (see chart for details).  Clinical Course    Patient were referred to Dr.Ganem who has perform colonoscopy in the past on him. Results for orders placed or performed during the hospital encounter of 05/13/16  Comprehensive metabolic panel  Result Value Ref Range   Sodium 139 135 - 145 mmol/L   Potassium 3.9 3.5 - 5.1 mmol/L   Chloride 105 101 - 111 mmol/L   CO2 28 22 - 32 mmol/L   Glucose, Bld 91 65 - 99 mg/dL   BUN 10 6 - 20 mg/dL   Creatinine, Ser 1.910.99 0.61 - 1.24 mg/dL   Calcium 9.5 8.9 - 47.810.3 mg/dL   Total Protein 7.6 6.5 - 8.1 g/dL   Albumin 4.4 3.5 - 5.0 g/dL   AST 23 15 - 41 U/L   ALT 18 17 - 63 U/L   Alkaline Phosphatase 49 38 - 126 U/L   Total Bilirubin 0.9 0.3 - 1.2 mg/dL   GFR calc non Af Amer >60 >60 mL/min   GFR calc Af Amer >60 >60 mL/min   Anion gap 6 5 - 15  CBC  Result Value Ref Range   WBC 6.9 4.0 - 10.5 K/uL   RBC 5.20 4.22 - 5.81 MIL/uL   Hemoglobin 13.8 13.0 - 17.0 g/dL   HCT 29.541.9 62.139.0 - 30.852.0 %   MCV 80.6 78.0 - 100.0 fL   MCH 26.5 26.0 - 34.0 pg   MCHC 32.9 30.0 - 36.0 g/dL   RDW 65.713.7 84.611.5 - 96.215.5 %   Platelets 280 150 - 400 K/uL  POC occult blood, ED  Result Value Ref Range   Fecal Occult Bld NEGATIVE NEGATIVE  Type and screen [facility]  Result Value Ref Range   ABO/RH(D) A POS    Antibody Screen NEG    Sample Expiration 05/16/2016   ABO/Rh  Result Value Ref Range   ABO/RH(D) A POS    No results found. Final Clinical Impressions(s) / ED Diagnoses  Diagnosis rectal bleeding by history Final diagnoses:  None    New Prescriptions New Prescriptions   No  medications on file     Doug SouSam Carollee Nussbaumer, MD 05/13/16 1951    Doug SouSam Jakirah Zaun, MD 05/13/16 1952

## 2016-05-13 NOTE — Progress Notes (Signed)
Patient listed as having [employer] insurance without a pcp.  EDCM spoke to patient at bedside.  Patient confrims he doesn't have a pcp.  EDCM explained importance and purpose of having a pcp.  [facility] provided [facility] with list of providers who accept [employer] within a 20 mile radius of patient's zip code.  Patient thankful for resources.  No further EDCM needs at this time.

## 2016-05-13 NOTE — ED Triage Notes (Addendum)
Pt reports "bright red" rectal bleeding x1 month w/o pain. Pt reports x2 episodes of rectal bleeding this AM. Pt denies abd pain. Pt states "I feel a little dizzy". Pt A+OX4, w/ normal gait.

## 2016-05-13 NOTE — ED Notes (Signed)
Lab called regarding no results from lab drawn at 1407.

## 2016-05-13 NOTE — Discharge Instructions (Signed)
Contact Dr. Luan MooreGanem's office tomorrow to schedule an appointment regarding rectal bleeding. Return if concern for any reason

## 2016-08-06 ENCOUNTER — Encounter (HOSPITAL_COMMUNITY): Payer: Self-pay

## 2016-08-06 ENCOUNTER — Emergency Department (HOSPITAL_COMMUNITY): Payer: Commercial Managed Care - HMO

## 2016-08-06 ENCOUNTER — Emergency Department (HOSPITAL_COMMUNITY)
Admission: EM | Admit: 2016-08-06 | Discharge: 2016-08-06 | Disposition: A | Discharge status: Home / Self Care | Payer: Commercial Managed Care - HMO | Attending: Emergency Medicine | Admitting: Emergency Medicine

## 2016-08-06 DIAGNOSIS — R05 Cough: Secondary | ICD-10-CM | POA: Diagnosis present

## 2016-08-06 DIAGNOSIS — J069 Acute upper respiratory infection, unspecified: Secondary | ICD-10-CM | POA: Diagnosis not present

## 2016-08-06 DIAGNOSIS — I10 Essential (primary) hypertension: Secondary | ICD-10-CM | POA: Diagnosis not present

## 2016-08-06 LAB — RAPID STREP SCREEN (MED CTR MEBANE ONLY): Streptococcus, Group A Screen (Direct): NEGATIVE

## 2016-08-06 MED ORDER — DOXYCYCLINE HYCLATE 100 MG PO CAPS: 100.0000 mg | ORAL_CAPSULE | Freq: Two times a day (BID) | ORAL | 0 refills | Status: DC

## 2016-08-06 NOTE — ED Provider Notes (Signed)
[facility]-EMERGENCY DEPT Provider Note   CSN: [id number] Arrival date & time: 08/06/16  0559     History   Chief Complaint Chief Complaint  Patient presents with  . URI    HPI March RummageRaymond Bennis is a 30 y.o. male.  HPI Patient presents with cough and feeling bad for last 3 weeks. States he began to have it felt little better than some worsening for last few days. States he's passed out a couple times with it. Has some dull chest pain. Occasionally coughs up some blood. No diarrhea. He has coughed until he is vomited but states he is not nauseous. No sick contacts. States he feels worn out. Minimal production of sputum. He has had a sore throat that he states is from coughing.  Past Medical History:  Diagnosis Date  . Hemorrhoids   . Hernia, abdominal   . Hypertension   . Substance abuse     Patient Active Problem List   Diagnosis Date Noted  . Depression (emotion) 12/13/2014    Past Surgical History:  Procedure Laterality Date  . COLONOSCOPY         Home Medications    Prior to Admission medications   Medication Sig Start Date End Date Taking? Authorizing Provider  doxycycline (VIBRAMYCIN) 100 MG capsule Take 1 capsule (100 mg total) by mouth 2 (two) times daily. 08/06/16   Benjiman CoreNathan Sarie Stall, MD  ibuprofen (ADVIL,MOTRIN) 800 MG tablet Take 1 tablet (800 mg total) by mouth 3 (three) times daily. Patient not taking: Reported on 05/13/2016 10/09/15   Elpidio AnisShari Upstill, PA-C    Family History Family History  Problem Relation Age of Onset  . Hypertension Mother   . Hypertension Maternal Grandmother   . Cancer Paternal Grandmother   . Heart failure      cousin/grandfather  . Breast cancer      male cousin/grandmother  . Heart attack      grandfather    Social History Social History  Substance Use Topics  . Smoking status: Never Smoker  . Smokeless tobacco: Never Used  . Alcohol use Yes     Allergies   Benadryl [diphenhydramine hcl]   Review of  Systems Review of Systems  Constitutional: Positive for appetite change and fatigue. Negative for unexpected weight change.  HENT: Positive for congestion. Negative for sore throat.   Respiratory: Positive for cough.   Cardiovascular: Negative for chest pain.  Gastrointestinal: Positive for vomiting. Negative for abdominal pain.  Genitourinary: Negative for flank pain.  Musculoskeletal: Negative for back pain.  Neurological: Positive for syncope.  Psychiatric/Behavioral: Negative for agitation.     Physical Exam Updated Vital Signs BP 148/75 (BP Location: Left Arm)   Pulse 91   Temp 98.1 F (36.7 C) (Oral)   Resp 18   SpO2 98%   Physical Exam  Constitutional: He appears well-developed.  HENT:  Head: Atraumatic.  Posterior pharyngeal erythema and some swelling of tonsils without exudate.  Neck: Neck supple.  Cardiovascular: Normal rate.   No murmur heard. Pulmonary/Chest: Effort normal.  Abdominal: Soft. There is no tenderness.  Musculoskeletal: Normal range of motion.  Neurological: He is alert.  Skin: Skin is warm.     ED Treatments / Results  Labs (all labs ordered are listed, but only abnormal results are displayed) Labs Reviewed  RAPID STREP SCREEN (NOT AT [facility])  CULTURE, GROUP A STREP [facility]    EKG  EKG Interpretation None       Radiology Dg Chest 2 View  Result Date: 08/06/2016  CLINICAL DATA:  30 year old male with cough and congestion for 3 weeks. Initial encounter. EXAM: CHEST  2 VIEW COMPARISON:  11/13/2015 and earlier. FINDINGS: Lung volumes are stable and within normal limits. Mediastinal contours remain within normal limits. Visualized tracheal air column is within normal limits. No pneumothorax, pulmonary edema, pleural effusion or confluent pulmonary opacity. Negative visible bowel gas pattern. No osseous abnormality identified. IMPRESSION: Negative.  No acute cardiopulmonary abnormality. Electronically Signed   By: Odessa FlemingH  Hall M.D.   On: 08/06/2016  07:15    Procedures Procedures (including critical care time)  Medications Ordered in ED Medications - No data to display   Initial Impression / Assessment and Plan / ED Course  I have reviewed the triage vital signs and the nursing notes.  Pertinent labs & imaging results that were available during my care of the patient were reviewed by me and considered in my medical decision making (see chart for details).  Clinical Course     Patient with URI symptoms for 3 weeks. Felt better then felt worse again. Negative strep and x-ray did not show pneumonia, however with 3 weeks of treatment we had discussion with the patient and try a course of doxycycline. Could still be viral but also could be a secondary infection came on top initial viral syndrome. Will discharge home.  Final Clinical Impressions(s) / ED Diagnoses   Final diagnoses:  Upper respiratory tract infection, unspecified type    New Prescriptions New Prescriptions   DOXYCYCLINE (VIBRAMYCIN) 100 MG CAPSULE    Take 1 capsule (100 mg total) by mouth 2 (two) times daily.     Benjiman CoreNathan Dalya Maselli, MD 08/06/16 [phone]

## 2016-08-06 NOTE — ED Triage Notes (Signed)
Pt complains of a URI for three weeks

## 2016-08-08 LAB — CULTURE, GROUP A STREP (THRC)

## 2018-06-23 ENCOUNTER — Encounter (HOSPITAL_COMMUNITY): Payer: Self-pay

## 2018-06-23 ENCOUNTER — Emergency Department (HOSPITAL_COMMUNITY)
Admission: EM | Admit: 2018-06-23 | Discharge: 2018-06-23 | Disposition: A | Discharge status: Home / Self Care | Payer: Commercial Managed Care - HMO | Attending: Emergency Medicine | Admitting: Emergency Medicine

## 2018-06-23 ENCOUNTER — Other Ambulatory Visit: Payer: Self-pay

## 2018-06-23 DIAGNOSIS — I10 Essential (primary) hypertension: Secondary | ICD-10-CM | POA: Insufficient documentation

## 2018-06-23 DIAGNOSIS — R109 Unspecified abdominal pain: Secondary | ICD-10-CM | POA: Diagnosis not present

## 2018-06-23 DIAGNOSIS — K625 Hemorrhage of anus and rectum: Secondary | ICD-10-CM | POA: Insufficient documentation

## 2018-06-23 LAB — TYPE AND SCREEN
ABO/RH(D): A POS
ANTIBODY SCREEN: NEGATIVE

## 2018-06-23 LAB — CBC
HCT: 45.9 % (ref 39.0–52.0)
HEMOGLOBIN: 14.4 g/dL (ref 13.0–17.0)
MCH: 25.8 pg — ABNORMAL LOW (ref 26.0–34.0)
MCHC: 31.4 g/dL (ref 30.0–36.0)
MCV: 82.1 fL (ref 80.0–100.0)
NRBC: 0 % (ref 0.0–0.2)
PLATELETS: 339 10*3/uL (ref 150–400)
RBC: 5.59 MIL/uL (ref 4.22–5.81)
RDW: 14.4 % (ref 11.5–15.5)
WBC: 7.9 10*3/uL (ref 4.0–10.5)

## 2018-06-23 LAB — BASIC METABOLIC PANEL
ANION GAP: 9 (ref 5–15)
BUN: 13 mg/dL (ref 6–20)
CO2: 25 mmol/L (ref 22–32)
Calcium: 9.2 mg/dL (ref 8.9–10.3)
Chloride: 107 mmol/L (ref 98–111)
Creatinine, Ser: 0.9 mg/dL (ref 0.61–1.24)
Glucose, Bld: 95 mg/dL (ref 70–99)
POTASSIUM: 4.3 mmol/L (ref 3.5–5.1)
SODIUM: 141 mmol/L (ref 135–145)

## 2018-06-23 NOTE — Discharge Instructions (Addendum)
These return for any problem.  Follow-up with your regular doctor as instructed.  Follow-up with your regular GI provider as instructed.

## 2018-06-23 NOTE — ED Notes (Signed)
Pt states understanding of DC instructions, readiness for DC.  Pt ambulating well at DC to go home.

## 2018-06-23 NOTE — ED Provider Notes (Signed)
[facility]-EMERGENCY DEPT Provider Note   CSN: [id number] Arrival date & time: 06/23/18  1759     History   Chief Complaint Chief Complaint  Patient presents with  . Rectal Bleeding  . Abdominal Pain    HPI Anthony Mullen is a 32 y.o. male.   32 year old male with prior medical history as detailed below presents with complaint of rectal bleeding.  Patient with a history of hemorrhoids.  Patient reports intermittent bright red blood per rectum with bowel movements over the last week.  Patient denies lightheadedness or syncopal symptoms.  She denies pain.  Patient denies fever.  Patient reports a prior history of same approximately 4 to 5 years ago.  He had a colonoscopy at that time which apparently showed hemorrhoids and several colonic polyps.     The history is provided by the patient and medical records.  Rectal Bleeding  Quality:  Bright red Amount:  Scant Duration:  1 week Timing:  Intermittent Chronicity:  Recurrent Similar prior episodes: yes   Relieved by:  Nothing Worsened by:  Defecation Ineffective treatments:  None tried Associated symptoms: no abdominal pain, no fever, no hematemesis and no light-headedness   Abdominal Pain    Associated symptoms include hematochezia. Pertinent negatives include fever.    Past Medical History:  Diagnosis Date  . Hemorrhoids   . Hernia, abdominal   . Hypertension   . Substance abuse [facility])     Patient Active Problem List   Diagnosis Date Noted  . Depression (emotion) 12/13/2014    Past Surgical History:  Procedure Laterality Date  . COLONOSCOPY          Home Medications    Prior to Admission medications   Medication Sig Start Date End Date Taking? Authorizing Provider  Pheniramine-PE-APAP (THERAFLU FLU & SORE THROAT) 20-10-650 MG PACK Take 1 Package by mouth daily as needed (flu symptoms).   Yes [provider]  doxycycline (VIBRAMYCIN) 100 MG capsule Take 1 capsule (100 mg  total) by mouth 2 (two) times daily. Patient not taking: Reported on 06/23/2018 08/06/16   Benjiman Core, MD  ibuprofen (ADVIL,MOTRIN) 800 MG tablet Take 1 tablet (800 mg total) by mouth 3 (three) times daily. Patient not taking: Reported on 05/13/2016 10/09/15   Elpidio Anis, PA-C  FLUoxetine (PROZAC) 20 MG capsule Take 1 capsule (20 mg total) by mouth daily. Patient not taking: Reported on 01/07/2015 12/12/14 01/10/15  Ofilia Neas, PA-C  omeprazole (PRILOSEC) 20 MG capsule Take 1 capsule (20 mg total) by mouth daily. Patient not taking: Reported on 01/07/2015 12/12/14 01/10/15  Ofilia Neas, PA-C    Family History Family History  Problem Relation Age of Onset  . Hypertension Mother   . Hypertension Maternal Grandmother   . Cancer Paternal Grandmother   . Heart failure Unknown        cousin/grandfather  . Breast cancer Unknown        male cousin/grandmother  . Heart attack Unknown        grandfather    Social History Social History   Tobacco Use  . Smoking status: Never Smoker  . Smokeless tobacco: Never Used  Substance Use Topics  . Alcohol use: Not Currently  . Drug use: No     Allergies   Benadryl [diphenhydramine hcl]   Review of Systems Review of Systems  Constitutional: Negative for fever.  Gastrointestinal: Positive for hematochezia. Negative for abdominal pain and hematemesis.  Neurological: Negative for light-headedness.  All other systems reviewed  and are negative.    Physical Exam Updated Vital Signs BP (!) 148/90 (BP Location: Left Arm)   Pulse 95   Temp 99.1 F (37.3 C) (Oral)   Resp 18   Ht 5\' 9"  (1.753 m)   Wt 117.9 kg   SpO2 97%   BMI 38.40 kg/m    Physical Exam  Constitutional: He is oriented to person, place, and time. He appears well-developed and well-nourished. No distress.  HENT:  Head: Normocephalic and atraumatic.  Mouth/Throat: Oropharynx is clear and moist.  Eyes: Pupils are equal, round, and reactive to light.  Conjunctivae and EOM are normal.  Neck: Normal range of motion. Neck supple.  Cardiovascular: Normal rate, regular rhythm and normal heart sounds.  Pulmonary/Chest: Effort normal and breath sounds normal. No respiratory distress.  Abdominal: Soft. He exhibits no distension. There is no tenderness.  Genitourinary:  Genitourinary Comments: Small nonthrombosed external hemorrhoids present on visual exam.  Patient declined digital rectal exam.  No evidence of active bleeding on visual exam alone.  Musculoskeletal: Normal range of motion. He exhibits no edema or deformity.  Neurological: He is alert and oriented to person, place, and time.  Skin: Skin is warm and dry.  Psychiatric: He has a normal mood and affect.  Nursing note and vitals reviewed.    ED Treatments / Results  Labs (all labs ordered are listed, but only abnormal results are displayed) Labs Reviewed  CBC - Abnormal; Notable for the following components:      Result Value   MCH 25.8 (*)    All other components within normal limits  BASIC METABOLIC PANEL  POC OCCULT BLOOD, ED  TYPE AND SCREEN    EKG None  Radiology No results found.  Procedures Procedures (including critical care time)  Medications Ordered in ED Medications - No data to display   Initial Impression / Assessment and Plan / ED Course  I have reviewed the triage vital signs and the nursing notes.  Pertinent labs & imaging results that were available during my care of the patient were reviewed by me and considered in my medical decision making (see chart for details).      MDM  Screen complete  Patient is presenting with complaint of for red blood per rectum with bowel movement.  Patient with prior history of hemorrhoids.  Screening labs today are without significant abnormality.  Patient without evidence of significant anemia.  Patient appears to be appropriate for discharge.  He has already established care with GI.  He reports to me that he  will call his GI doctor tomorrow to obtain a repeat evaluation.  Strict return precautions given and understood.  Final Clinical Impressions(s) / ED Diagnoses   Final diagnoses:  Rectal bleeding    ED Discharge Orders    None      Wynetta FinesMessick, Solara Goodchild C, MD 06/23/18 2025

## 2018-06-23 NOTE — ED Triage Notes (Signed)
Patient c/o intermittent mid abdominal pain x 1 week and patient reports bright red rectal bleeding with clots x 1 week. Patient does report a history of hemorrhoids.

## 2018-06-23 NOTE — ED Notes (Signed)
Pt declines occult blood exam.

## 2018-11-10 ENCOUNTER — Telehealth: Payer: Commercial Managed Care - HMO | Admitting: Nurse Practitioner

## 2018-11-10 DIAGNOSIS — R6889 Other general symptoms and signs: Principal | ICD-10-CM

## 2018-11-10 DIAGNOSIS — Z20822 Contact with and (suspected) exposure to covid-19: Secondary | ICD-10-CM

## 2018-11-10 MED ORDER — BENZONATATE 100 MG PO CAPS: 100.0000 mg | ORAL_CAPSULE | Freq: Three times a day (TID) | ORAL | 0 refills | Status: DC | PRN

## 2018-11-10 NOTE — Progress Notes (Signed)
E-Visit for Corona Virus Screening  Based on your current symptoms, it seems unlikely that your symptoms are related to the Coronavirus.   Coronavirus disease 2019 (COVID-19) is a respiratory illness that can spread from person to person. The virus that causes COVID-19 is a new virus that was first identified in the country of [place] but is now found in multiple other countries and has spread to the [place].  Symptoms associated with the virus are mild to severe fever, cough, and shortness of breath. There is currently no vaccine to protect against COVID-19, and there is no specific antiviral treatment for the virus.   To be considered HIGH RISK for Coronavirus (COVID-19), you have to meet the following criteria:  . Traveled to [place], [place], [place], [place] or [place]; or in the [place] to [place], [place], [place], or [place]; and have fever, cough, and shortness of breath within the last 2 weeks of travel OR  . Been in close contact with a person diagnosed with COVID-19 within the last 2 weeks and have fever, cough, and shortness of breath  . IF YOU DO NOT MEET THESE CRITERIA, YOU ARE CONSIDERED LOW RISK FOR COVID-19.   It is vitally important that if you feel that you have an infection such as this virus or any other virus that you stay home and away from places where you may spread it to others.  You should self-quarantine for 14 days if you have symptoms that could potentially be coronavirus and avoid contact with people age 31 and older.   You can use medication such as A prescription cough medication called Tessalon Perles 100 mg. You may take 1-2 capsules every 8 hours as needed for cough  You may also take acetaminophen (Tylenol) as needed for fever.   Reduce your risk of any infection by using the same precautions used for avoiding the common cold or flu:  . Wash your hands often with soap and warm water for at least 20 seconds.  If soap and water are not readily  available, use an alcohol-based hand sanitizer with at least 60% alcohol.  . If coughing or sneezing, cover your mouth and nose by coughing or sneezing into the elbow areas of your shirt or coat, into a tissue or into your sleeve (not your hands). . Avoid shaking hands with others and consider head nods or verbal greetings only. . Avoid touching your eyes, nose, or mouth with unwashed hands.  . Avoid close contact with people who are sick. . Avoid places or events with large numbers of people in one location, like concerts or sporting events. . Carefully consider travel plans you have or are making. . If you are planning any travel outside or inside the [place], visit the CDC's Travelers' Health webpage for the latest health notices. . If you have some symptoms but not all symptoms, continue to monitor at home and seek medical attention if your symptoms worsen. . If you are having a medical emergency, call 911.  HOME CARE . Only take medications as instructed by your medical team. . Drink plenty of fluids and get plenty of rest. . A steam or ultrasonic humidifier can help if you have congestion.   GET HELP RIGHT AWAY IF: . You develop worsening fever. . You become short of breath . You cough up blood. . Your symptoms become more severe MAKE SURE YOU   Understand these instructions.  Will watch your condition.  Will get help   right away if you are not doing well or get worse.  Your e-visit answers were reviewed by a board certified advanced clinical practitioner to complete your personal care plan.  Depending on the condition, your plan could have included both over the counter or prescription medications.  If there is a problem please reply once you have received a response from your provider. Your safety is important to us.  If you have drug allergies check your prescription carefully.    You can use MyChart to ask questions about today's visit, request a non-urgent call back, or ask for a  work or school excuse for 24 hours related to this e-Visit. If it has been greater than 24 hours you will need to follow up with your provider, or enter a new e-Visit to address those concerns. You will get an e-mail in the next two days asking about your experience.  I hope that your e-visit has been valuable and will speed your recovery. Thank you for using e-visits.   5 minutes spent reviewing and documenting in chart.  

## 2019-04-11 ENCOUNTER — Encounter (HOSPITAL_COMMUNITY): Payer: Self-pay | Admitting: Emergency Medicine

## 2019-04-11 ENCOUNTER — Other Ambulatory Visit: Payer: Self-pay

## 2019-04-11 ENCOUNTER — Emergency Department (HOSPITAL_COMMUNITY)
Admission: EM | Admit: 2019-04-11 | Discharge: 2019-04-11 | Disposition: A | Discharge status: Home / Self Care | Payer: 59 | Attending: Emergency Medicine | Admitting: Emergency Medicine

## 2019-04-11 DIAGNOSIS — R202 Paresthesia of skin: Secondary | ICD-10-CM | POA: Insufficient documentation

## 2019-04-11 DIAGNOSIS — R200 Anesthesia of skin: Secondary | ICD-10-CM | POA: Diagnosis present

## 2019-04-11 DIAGNOSIS — I10 Essential (primary) hypertension: Secondary | ICD-10-CM | POA: Insufficient documentation

## 2019-04-11 NOTE — Discharge Instructions (Addendum)
Call to make an appointment with [facility] for further study of your left hand numbness.

## 2019-04-11 NOTE — ED Triage Notes (Addendum)
Pt here for eval of left hand numbness that radiates into the forearm, worse when driving and the sensation to the forearm leaves when he is not driving. No neurological deficits.

## 2019-04-11 NOTE — ED Provider Notes (Signed)
[facility] Provider Note   CSN: [id number] Arrival date & time: 04/11/19  2151     History   Chief Complaint Chief Complaint  Patient presents with  . Hand numbness    HPI Anthony Mullen is a 33 y.o. male.     Patient to ED for evaluation of numbness to left 4th and 5th fingers for the past 2 days without pain, weakness or swelling. No injury. No neck pain or history of similar symptoms. He states it does not prevent him from his usual activities.   The history is provided by the patient.    Past Medical History:  Diagnosis Date  . Hemorrhoids   . Hernia, abdominal   . Hypertension   . Substance abuse [facility](HCC)     Patient Active Problem List   Diagnosis Date Noted  . Depression (emotion) 12/13/2014    Past Surgical History:  Procedure Laterality Date  . COLONOSCOPY          Home Medications    Prior to Admission medications   Medication Sig Start Date End Date Taking? Authorizing Provider  benzonatate (TESSALON PERLES) 100 MG capsule Take 1 capsule (100 mg total) by mouth 3 (three) times daily as needed. 11/10/18   Daphine DeutscherMartin, Mary-Margaret, FNP  doxycycline (VIBRAMYCIN) 100 MG capsule Take 1 capsule (100 mg total) by mouth 2 (two) times daily. Patient not taking: Reported on 06/23/2018 08/06/16   Benjiman CorePickering, Nathan, MD  ibuprofen (ADVIL,MOTRIN) 800 MG tablet Take 1 tablet (800 mg total) by mouth 3 (three) times daily. Patient not taking: Reported on 05/13/2016 10/09/15   Elpidio AnisUpstill, Eyva Califano, PA-C  Pheniramine-PE-APAP (THERAFLU FLU & SORE THROAT) 20-10-650 MG PACK Take 1 Package by mouth daily as needed (flu symptoms).    [provider]    Family History Family History  Problem Relation Age of Onset  . Hypertension Mother   . Hypertension Maternal Grandmother   . Cancer Paternal Grandmother   . Heart failure Other        cousin/grandfather  . Breast cancer Other        male cousin/grandmother  . Heart attack Other         grandfather    Social History Social History   Tobacco Use  . Smoking status: Never Smoker  . Smokeless tobacco: Never Used  Substance Use Topics  . Alcohol use: Not Currently  . Drug use: No     Allergies   Benadryl [diphenhydramine hcl]   Review of Systems Review of Systems  Musculoskeletal:       See HPI.  Skin: Negative for color change.  Neurological: Positive for numbness. Negative for weakness.     Physical Exam Updated Vital Signs BP (!) 162/87   Pulse 81   Temp 99.5 F (37.5 C) (Oral)   Resp 16   SpO2 98%   Physical Exam Constitutional:      Appearance: He is well-developed.  Neck:     Musculoskeletal: Normal range of motion.  Pulmonary:     Effort: Pulmonary effort is normal.  Musculoskeletal: Normal range of motion.     Comments: No midline cervical tenderness. Left arm is unremarkable in appearance without swelling or redness. No strength deficits.   Skin:    General: Skin is warm and dry.  Neurological:     Mental Status: He is alert and oriented to person, place, and time.     Comments: Decreased sensation to light touch left 4th and 5th fingers.  ED Treatments / Results  Labs (all labs ordered are listed, but only abnormal results are displayed) Labs Reviewed - No data to display  EKG None  Radiology No results found.  Procedures Procedures (including critical care time)  Medications Ordered in ED Medications - No data to display   Initial Impression / Assessment and Plan / ED Course  I have reviewed the triage vital signs and the nursing notes.  Pertinent labs & imaging results that were available during my care of the patient were reviewed by me and considered in my medical decision making (see chart for details).        Patient to ED with 2 days of numb sensation to left 4th and 5th fingers.   No neck pain, sxs isolated to 4th and 5th fingers. Doubt cervical radiculopathy. No objective weakness.   He  can be discharged home with neuro follow up for nerve conduction studies as indicated.  Final Clinical Impressions(s) / ED Diagnoses   Final diagnoses:  Left hand paresthesia    ED Discharge Orders    None       Charlann Lange, PA-C 04/12/19 [id number]    Sherwood Gambler, MD 04/12/19 1950

## 2019-04-11 NOTE — ED Notes (Signed)
Discharge instructions discussed with Pt. Pt verbalized understanding. Pt stable and ambulatory.    

## 2019-08-27 ENCOUNTER — Other Ambulatory Visit: Payer: Self-pay

## 2019-08-27 ENCOUNTER — Emergency Department (HOSPITAL_COMMUNITY)
Admission: EM | Admit: 2019-08-27 | Discharge: 2019-08-27 | Disposition: A | Discharge status: Home / Self Care | Payer: 59 | Attending: Emergency Medicine | Admitting: Emergency Medicine

## 2019-08-27 ENCOUNTER — Encounter (HOSPITAL_COMMUNITY): Payer: Self-pay | Admitting: Emergency Medicine

## 2019-08-27 ENCOUNTER — Emergency Department (HOSPITAL_COMMUNITY): Payer: 59

## 2019-08-27 DIAGNOSIS — I10 Essential (primary) hypertension: Secondary | ICD-10-CM | POA: Diagnosis not present

## 2019-08-27 DIAGNOSIS — K529 Noninfective gastroenteritis and colitis, unspecified: Secondary | ICD-10-CM | POA: Diagnosis not present

## 2019-08-27 DIAGNOSIS — R1084 Generalized abdominal pain: Secondary | ICD-10-CM | POA: Diagnosis present

## 2019-08-27 LAB — CBC
HCT: 46.1 % (ref 39.0–52.0)
Hemoglobin: 14.6 g/dL (ref 13.0–17.0)
MCH: 25.6 pg — ABNORMAL LOW (ref 26.0–34.0)
MCHC: 31.7 g/dL (ref 30.0–36.0)
MCV: 80.9 fL (ref 80.0–100.0)
Platelets: 309 10*3/uL (ref 150–400)
RBC: 5.7 MIL/uL (ref 4.22–5.81)
RDW: 14.5 % (ref 11.5–15.5)
WBC: 5.9 10*3/uL (ref 4.0–10.5)
nRBC: 0 % (ref 0.0–0.2)

## 2019-08-27 LAB — COMPREHENSIVE METABOLIC PANEL
ALT: 36 U/L (ref 0–44)
AST: 35 U/L (ref 15–41)
Albumin: 4.5 g/dL (ref 3.5–5.0)
Alkaline Phosphatase: 50 U/L (ref 38–126)
Anion gap: 10 (ref 5–15)
BUN: 11 mg/dL (ref 6–20)
CO2: 25 mmol/L (ref 22–32)
Calcium: 9.2 mg/dL (ref 8.9–10.3)
Chloride: 104 mmol/L (ref 98–111)
Creatinine, Ser: 0.81 mg/dL (ref 0.61–1.24)
GFR calc Af Amer: >60 mL/min (ref >60)
GFR calc non Af Amer: >60 mL/min (ref >60)
Glucose, Bld: 93 mg/dL (ref 70–99)
Potassium: 3.9 mmol/L (ref 3.5–5.1)
Sodium: 139 mmol/L (ref 135–145)
Total Bilirubin: 0.8 mg/dL (ref 0.3–1.2)
Total Protein: 7.9 g/dL (ref 6.5–8.1)

## 2019-08-27 LAB — LIPASE, BLOOD: Lipase: 20 U/L (ref 11–51)

## 2019-08-27 LAB — TYPE AND SCREEN
ABO/RH(D): A POS
Antibody Screen: NEGATIVE

## 2019-08-27 LAB — POC OCCULT BLOOD, ED: Fecal Occult Bld: NEGATIVE

## 2019-08-27 MED ORDER — MORPHINE SULFATE (PF) 4 MG/ML IV SOLN
4.0000 mg | Freq: Once | INTRAVENOUS | Status: AC
  Administered 2019-08-27: 15:00:00 4 mg via INTRAVENOUS
  Filled 2019-08-27: qty 1

## 2019-08-27 MED ORDER — METRONIDAZOLE 500 MG PO TABS: 500.0000 mg | ORAL_TABLET | Freq: Three times a day (TID) | ORAL | 0 refills | Status: AC

## 2019-08-27 MED ORDER — SODIUM CHLORIDE 0.9% FLUSH: 3.0000 mL | Freq: Once | INTRAVENOUS | Status: DC

## 2019-08-27 MED ORDER — IOHEXOL 300 MG/ML  SOLN
100.0000 mL | Freq: Once | INTRAMUSCULAR | Status: AC | PRN
  Administered 2019-08-27: 15:00:00 100 mL via INTRAVENOUS

## 2019-08-27 MED ORDER — CIPROFLOXACIN HCL 500 MG PO TABS: 500.0000 mg | ORAL_TABLET | Freq: Two times a day (BID) | ORAL | 0 refills | Status: AC

## 2019-08-27 MED ORDER — SODIUM CHLORIDE (PF) 0.9 % IJ SOLN
INTRAMUSCULAR | Status: AC
  Filled 2019-08-27: qty 50

## 2019-08-27 NOTE — ED Provider Notes (Signed)
[facility]-EMERGENCY DEPT Provider Note   CSN: [id number] Arrival date & time: 08/27/19  1302     History Chief Complaint  Patient presents with  . Abdominal Pain  . Rectal Bleeding    Anthony Mullen is a 34 y.o. male with past medical history of hypertension, hemorrhoids presenting to the ED with a chief complaint of abdominal pain and rectal bleeding.  For the past week been having dark red blood with his bowel movements.  Has had vague abdominal cramping but symptoms got worse today when he was at the gym.  Severe right-sided abdominal pain throughout the lower abdomen and flank area.  Symptoms have resolved slightly but still persist.  Denies any nausea, vomiting, urinary symptoms, history of kidney stones.  He has had rectal bleeding in the past which was attributed to hemorrhoids.  His last colonoscopy was about 3 months ago was unremarkable.  He is not currently anticoagulated with medication.  Denies prior abdominal surgeries.  HPI     Past Medical History:  Diagnosis Date  . Hemorrhoids   . Hernia, abdominal   . Hypertension   . Substance abuse [facility])     Patient Active Problem List   Diagnosis Date Noted  . Depression (emotion) 12/13/2014    Past Surgical History:  Procedure Laterality Date  . COLONOSCOPY         Family History  Problem Relation Age of Onset  . Hypertension Mother   . Hypertension Maternal Grandmother   . Cancer Paternal Grandmother   . Heart failure Other        cousin/grandfather  . Breast cancer Other        male cousin/grandmother  . Heart attack Other        grandfather    Social History   Tobacco Use  . Smoking status: Never Smoker  . Smokeless tobacco: Never Used  Substance Use Topics  . Alcohol use: Not Currently  . Drug use: No    Home Medications Prior to Admission medications   Medication Sig Start Date End Date Taking? Authorizing Provider  benzonatate (TESSALON PERLES) 100 MG capsule Take  1 capsule (100 mg total) by mouth 3 (three) times daily as needed. Patient not taking: Reported on 08/27/2019 11/10/18   Bennie Pierini, FNP  ciprofloxacin (CIPRO) 500 MG tablet Take 1 tablet (500 mg total) by mouth every 12 (twelve) hours for 7 days. 08/27/19 09/03/19  Pricilla Moehle, PA-C  doxycycline (VIBRAMYCIN) 100 MG capsule Take 1 capsule (100 mg total) by mouth 2 (two) times daily. Patient not taking: Reported on 06/23/2018 08/06/16   Benjiman Core, MD  ibuprofen (ADVIL,MOTRIN) 800 MG tablet Take 1 tablet (800 mg total) by mouth 3 (three) times daily. Patient not taking: Reported on 05/13/2016 10/09/15   Elpidio Anis, PA-C  metroNIDAZOLE (FLAGYL) 500 MG tablet Take 1 tablet (500 mg total) by mouth 3 (three) times daily for 7 days. 08/27/19 09/03/19  Dietrich Pates, PA-C    Allergies    Benadryl [diphenhydramine hcl]  Review of Systems   Review of Systems  Constitutional: Negative for appetite change, chills and fever.  HENT: Negative for ear pain, rhinorrhea, sneezing and sore throat.   Eyes: Negative for photophobia and visual disturbance.  Respiratory: Negative for cough, chest tightness, shortness of breath and wheezing.   Cardiovascular: Negative for chest pain and palpitations.  Gastrointestinal: Positive for abdominal pain, blood in stool and diarrhea. Negative for constipation, nausea and vomiting.  Genitourinary: Negative for dysuria, hematuria and urgency.  Musculoskeletal: Negative for myalgias.  Skin: Negative for rash.  Neurological: Negative for dizziness, weakness and light-headedness.    Physical Exam Updated Vital Signs BP 132/82 (BP Location: Left Arm)   Pulse (!) 101   Temp 98.7 F (37.1 C) (Oral)   Resp 19   SpO2 99%   Physical Exam Vitals and nursing note reviewed. Exam conducted with a chaperone present.  Constitutional:      General: He is not in acute distress.    Appearance: He is well-developed.  HENT:     Head: Normocephalic and atraumatic.      Nose: Nose normal.  Eyes:     General: No scleral icterus.       Left eye: No discharge.     Conjunctiva/sclera: Conjunctivae normal.  Cardiovascular:     Rate and Rhythm: Normal rate and regular rhythm.     Heart sounds: Normal heart sounds. No murmur. No friction rub. No gallop.   Pulmonary:     Effort: Pulmonary effort is normal. No respiratory distress.     Breath sounds: Normal breath sounds.  Abdominal:     General: Bowel sounds are normal. There is no distension.     Palpations: Abdomen is soft.     Tenderness: There is abdominal tenderness in the right lower quadrant and periumbilical area. There is no guarding.  Genitourinary:    Rectum: External hemorrhoid present.     Comments: Nonthrombosed external hemorrhoid noted. Musculoskeletal:        General: Normal range of motion.     Cervical back: Normal range of motion and neck supple.  Skin:    General: Skin is warm and dry.     Findings: No rash.  Neurological:     Mental Status: He is alert.     Motor: No abnormal muscle tone.     Coordination: Coordination normal.     ED Results / Procedures / Treatments   Labs (all labs ordered are listed, but only abnormal results are displayed) Labs Reviewed  CBC - Abnormal; Notable for the following components:      Result Value   MCH 25.6 (*)    All other components within normal limits  LIPASE, BLOOD  COMPREHENSIVE METABOLIC PANEL  POC OCCULT BLOOD, ED  TYPE AND SCREEN    EKG None  Radiology CT ABDOMEN PELVIS W CONTRAST  Result Date: 08/27/2019 CLINICAL DATA:  Rectal bleeding EXAM: CT ABDOMEN AND PELVIS WITH CONTRAST TECHNIQUE: Multidetector CT imaging of the abdomen and pelvis was performed using the standard protocol following bolus administration of intravenous contrast. CONTRAST:  133mL OMNIPAQUE IOHEXOL 300 MG/ML  SOLN COMPARISON:  Abdominal radiograph 06/30/2009 FINDINGS: Lower chest: Lung bases are clear. Normal heart size. No pericardial effusion.  Hepatobiliary: No focal liver abnormality is seen. No gallstones, gallbladder wall thickening, or biliary dilatation. Pancreas: Unremarkable. No pancreatic ductal dilatation or surrounding inflammatory changes. Spleen: Normal in size without focal abnormality. Small accessory splenules. Adrenals/Urinary Tract: Adrenal glands are unremarkable. Kidneys are normal, without renal calculi, focal lesion, or hydronephrosis. Bladder is unremarkable. Stomach/Bowel: Distal esophagus, stomach and duodenal sweep are unremarkable. Proximal small bowel has a fairly normal appearance however the distal small bowel and colon demonstrates some mild mural thickening which appears somewhat edematous in nature CT hypoattenuation with faint adjacent stranding in the omentum and mesentery. The appendix is at the upper limits of normal for diameter but does appear air-filled and otherwise unremarkable without focal periappendiceal inflammation. Vascular/Lymphatic: The aorta is normal caliber. No suspicious or  enlarged lymph nodes in the included lymphatic chains. Reproductive: The prostate and seminal vesicles are unremarkable. Other: No free fluid or air. Some increased attenuation in the central mesentery with haziness adjacent the colon and small bowel. Musculoskeletal: No acute osseous abnormality or suspicious osseous lesion. IMPRESSION: 1. Mild mural thickening of the distal small bowel and colon with faint stranding in the omentum and mesentery may represent an inflammatory or infectious enterocolitis. No evidence of bowel obstruction. Electronically Signed   By: Kreg Shropshire M.D.   On: 08/27/2019 15:16    Procedures Procedures (including critical care time)  Medications Ordered in ED Medications  sodium chloride flush (NS) 0.9 % injection 3 mL (has no administration in time range)  sodium chloride (PF) 0.9 % injection (has no administration in time range)  morphine 4 MG/ML injection 4 mg (4 mg Intravenous Given 08/27/19  1438)  iohexol (OMNIPAQUE) 300 MG/ML solution 100 mL (100 mLs Intravenous Contrast Given 08/27/19 1455)    ED Course  I have reviewed the triage vital signs and the nursing notes.  Pertinent labs & imaging results that were available during my care of the patient were reviewed by me and considered in my medical decision making (see chart for details).    MDM Rules/Calculators/A&P                      34 year old male with past medical history of hemorrhoids, hypertension presenting to the ED with a chief complaint of abdominal pain and rectal bleeding.  Symptoms have been going on for the past week.  Pain worsened today while he was at the gym.  Tenderness to palpation in the right side of the abdomen without rebound or guarding.  There is an external hemorrhoid noted on examination that does not appear thrombosed.  Stool is Hemoccult negative.  Hemoglobin hematocrit are stable, within normal limits.  CMP, lipase unremarkable.  CT of abdomen pelvis shows findings consistent with infectious or inflammatory colitis.  With his recent colonoscopy I feel this is more likely due to infectious colitis.  Will place him on Cipro, Flagyl and have him follow-up with his GI specialist.  Patient is agreeable to the plan.  He is hemodynamically stable and will be discharged home.  Patient is hemodynamically stable, in NAD, and able to ambulate in the ED. Evaluation does not show pathology that would require ongoing emergent intervention or inpatient treatment. I explained the diagnosis to the patient. Pain has been managed and has no complaints prior to discharge. Patient is comfortable with above plan and is stable for discharge at this time. All questions were answered prior to disposition. Strict return precautions for returning to the ED were discussed. Encouraged follow up with PCP.   An After Visit Summary was printed and given to the patient.   Portions of this note were generated with [employer]. Dictation errors may occur despite best attempts at proofreading.  Final Clinical Impression(s) / ED Diagnoses Final diagnoses:  Colitis    Rx / DC Orders ED Discharge Orders         Ordered    ciprofloxacin (CIPRO) 500 MG tablet  Every 12 hours     08/27/19 1534    metroNIDAZOLE (FLAGYL) 500 MG tablet  3 times daily     08/27/19 1534           Dietrich Pates, PA-C 08/27/19 1611    Raeford Razor, MD 08/27/19 1631

## 2019-08-27 NOTE — ED Triage Notes (Signed)
Per pt, states he has had some rectal bleeding-was dark and today bright red-now having sever right abdominal pain

## 2019-08-27 NOTE — Discharge Instructions (Signed)
Take both of the antibiotics as directed for the entire course regardless of symptom improvement to prevent worsening or recurrence of your infection. Return to the ED if you start to develop worsening pain, fever, lightheadedness, increased bleeding.

## 2020-04-06 ENCOUNTER — Emergency Department (HOSPITAL_COMMUNITY)
Admission: EM | Admit: 2020-04-06 | Discharge: 2020-04-06 | Disposition: A | Discharge status: Home / Self Care | Payer: No Typology Code available for payment source | Attending: Emergency Medicine | Admitting: Emergency Medicine

## 2020-04-06 ENCOUNTER — Encounter (HOSPITAL_COMMUNITY): Payer: Self-pay | Admitting: *Deleted

## 2020-04-06 DIAGNOSIS — R000 Tachycardia, unspecified: Secondary | ICD-10-CM | POA: Diagnosis present

## 2020-04-06 DIAGNOSIS — I10 Essential (primary) hypertension: Secondary | ICD-10-CM | POA: Diagnosis not present
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
## 2020-04-26 ENCOUNTER — Other Ambulatory Visit: Payer: Self-pay

## 2020-04-26 ENCOUNTER — Ambulatory Visit (HOSPITAL_COMMUNITY)
Admission: EM | Admit: 2020-04-26 | Discharge: 2020-04-26 | Disposition: A | Discharge status: Home / Self Care | Payer: 59 | Attending: Family Medicine | Admitting: Family Medicine

## 2020-04-26 ENCOUNTER — Encounter (HOSPITAL_COMMUNITY): Payer: Self-pay | Admitting: Emergency Medicine

## 2020-04-26 DIAGNOSIS — G4700 Insomnia, unspecified: Secondary | ICD-10-CM | POA: Diagnosis present

## 2020-04-26 DIAGNOSIS — R079 Chest pain, unspecified: Secondary | ICD-10-CM | POA: Diagnosis present

## 2020-04-26 DIAGNOSIS — F430 Acute stress reaction: Secondary | ICD-10-CM | POA: Insufficient documentation

## 2020-04-26 DIAGNOSIS — F410 Panic disorder [episodic paroxysmal anxiety] without agoraphobia: Secondary | ICD-10-CM | POA: Diagnosis present

## 2020-04-26 DIAGNOSIS — F411 Generalized anxiety disorder: Secondary | ICD-10-CM | POA: Diagnosis not present

## 2020-04-26 LAB — COMPREHENSIVE METABOLIC PANEL
ALT: 25 U/L (ref 0–44)
AST: 39 U/L (ref 15–41)
Albumin: 4.4 g/dL (ref 3.5–5.0)
Alkaline Phosphatase: 50 U/L (ref 38–126)
Anion gap: 11 (ref 5–15)
BUN: 9 mg/dL (ref 6–20)
CO2: 23 mmol/L (ref 22–32)
Calcium: 9.7 mg/dL (ref 8.9–10.3)
Chloride: 106 mmol/L (ref 98–111)
Creatinine, Ser: 0.97 mg/dL (ref 0.61–1.24)
GFR calc Af Amer: >60 mL/min (ref >60)
GFR calc non Af Amer: >60 mL/min (ref >60)
Glucose, Bld: 103 mg/dL — ABNORMAL HIGH (ref 70–99)
Potassium: 4 mmol/L (ref 3.5–5.1)
Sodium: 140 mmol/L (ref 135–145)
Total Bilirubin: 0.6 mg/dL (ref 0.3–1.2)
Total Protein: 7.7 g/dL (ref 6.5–8.1)

## 2020-04-26 LAB — CBC WITH DIFFERENTIAL/PLATELET
Abs Immature Granulocytes: 0.02 10*3/uL (ref 0.00–0.07)
Basophils Absolute: 0 10*3/uL (ref 0.0–0.1)
Basophils Relative: 0 %
Eosinophils Absolute: 0.1 10*3/uL (ref 0.0–0.5)
Eosinophils Relative: 1 %
HCT: 44.8 % (ref 39.0–52.0)
Hemoglobin: 14.4 g/dL (ref 13.0–17.0)
Immature Granulocytes: 0 %
Lymphocytes Relative: 29 %
Lymphs Abs: 2.2 10*3/uL (ref 0.7–4.0)
MCH: 25.5 pg — ABNORMAL LOW (ref 26.0–34.0)
MCHC: 32.1 g/dL (ref 30.0–36.0)
MCV: 79.4 fL — ABNORMAL LOW (ref 80.0–100.0)
Monocytes Absolute: 0.7 10*3/uL (ref 0.1–1.0)
Monocytes Relative: 9 %
Neutro Abs: 4.6 10*3/uL (ref 1.7–7.7)
Neutrophils Relative %: 61 %
Platelets: 301 10*3/uL (ref 150–400)
RBC: 5.64 MIL/uL (ref 4.22–5.81)
RDW: 13.9 % (ref 11.5–15.5)
WBC: 7.7 10*3/uL (ref 4.0–10.5)
nRBC: 0 % (ref 0.0–0.2)

## 2020-04-26 MED ORDER — HYDROXYZINE HCL 25 MG PO TABS: 25.0000 mg | ORAL_TABLET | Freq: Four times a day (QID) | ORAL | 0 refills | Status: DC
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
## 2020-04-26 NOTE — ED Provider Notes (Signed)
[facility]    CSN: [id number] Arrival date & time: 04/26/20  1841      History   Chief Complaint Chief Complaint  Patient presents with  . Nausea  . Chest Pain    HPI Anthony Mullen is a 34 y.o. male.   Reports that he has experienced fatigue, SOB, chest pain, diaphoresis, left hand tingling episode earlier when making dinner tonight. Is a Emergency planning/management officer and reports that there has been some increased stress and a recent incident at work that he keeps replaying in his head. Reports that this is affecting his sleep patterns as well. Reports that he has tried melatonin with no success. Reports that he was worried about the episode that occurred tonight. Denies headaches, current chest pain, radiating pain, current SOB, diaphoresis, and paraesthesias. Denies ever having these symptoms before. There are no alleviating factors. Symptoms seem to aggravated with triggers (thoughts, phone calls, etc). Denies ST, nausea, vomiting, diarrhea, rash, fever, other symptoms.  ROS per HPI  The history is provided by the patient.  Chest Pain   Past Medical History:  Diagnosis Date  . Hemorrhoids   . Hernia, abdominal   . Hypertension   . Substance abuse [facility])     Patient Active Problem List   Diagnosis Date Noted  . Depression (emotion) 12/13/2014    Past Surgical History:  Procedure Laterality Date  . COLONOSCOPY         Home Medications    Prior to Admission medications   Medication Sig Start Date End Date Taking? Authorizing Provider  hydrOXYzine (ATARAX/VISTARIL) 25 MG tablet Take 1 tablet (25 mg total) by mouth every 6 (six) hours. 04/26/20   Moshe Cipro, NP    Family History Family History  Problem Relation Age of Onset  . Hypertension Mother   . Hypertension Maternal Grandmother   . Cancer Paternal Grandmother   . Heart failure Other        cousin/grandfather  . Breast cancer Other        male cousin/grandmother  . Heart attack Other         grandfather    Social History Social History   Tobacco Use  . Smoking status: Never Smoker  . Smokeless tobacco: Never Used  Vaping Use  . Vaping Use: Never used  Substance Use Topics  . Alcohol use: Not Currently  . Drug use: No     Allergies   Benadryl [diphenhydramine hcl]   Review of Systems Review of Systems  Cardiovascular: Positive for chest pain.     Physical Exam Triage Vital Signs ED Triage Vitals  Enc Vitals Group     BP 04/26/20 1850 (!) 148/81     Pulse Rate 04/26/20 1850 91     Resp 04/26/20 1850 20     Temp 04/26/20 1850 99.1 F (37.3 C)     Temp Source 04/26/20 1850 Oral     SpO2 04/26/20 1850 98 %     Weight --      Height --      Head Circumference --      Peak Flow --      Pain Score 04/26/20 1849 0     Pain Loc --      Pain Edu? --      Excl. in GC? --    No data found.  Updated Vital Signs BP (!) 148/81 (BP Location: Right Arm)   Pulse 91   Temp 99.1 F (37.3 C) (Oral)   Resp  20   SpO2 98%   Visual Acuity Right Eye Distance:   Left Eye Distance:   Bilateral Distance:    Right Eye Near:   Left Eye Near:    Bilateral Near:     Physical Exam Vitals and nursing note reviewed.  Constitutional:      General: He is not in acute distress.    Appearance: He is well-developed. He is not ill-appearing.  HENT:     Head: Normocephalic and atraumatic.  Eyes:     Conjunctiva/sclera: Conjunctivae normal.     Pupils: Pupils are equal, round, and reactive to light.  Cardiovascular:     Rate and Rhythm: Normal rate and regular rhythm.  No extrasystoles are present.    Chest Wall: PMI is not displaced.     Pulses:          Radial pulses are 2+ on the right side and 2+ on the left side.       Dorsalis pedis pulses are 2+ on the right side and 2+ on the left side.     Heart sounds: Normal heart sounds. Heart sounds not distant. No murmur heard.  No systolic murmur is present.  No diastolic murmur is present.  No friction rub. No  gallop. No S3 or S4 sounds.   Pulmonary:     Effort: Pulmonary effort is normal. No tachypnea, accessory muscle usage or respiratory distress.     Breath sounds: Normal breath sounds. No stridor. No decreased breath sounds, wheezing, rhonchi or rales.  Chest:     Chest wall: No mass, deformity, tenderness, crepitus or edema. There is no dullness to percussion.  Abdominal:     Palpations: Abdomen is soft.     Tenderness: There is no abdominal tenderness.  Musculoskeletal:        General: Normal range of motion.     Cervical back: Normal range of motion and neck supple.  Skin:    General: Skin is warm and dry.     Capillary Refill: Capillary refill takes less than 2 seconds.  Neurological:     General: No focal deficit present.     Mental Status: He is alert and oriented to person, place, and time.  Psychiatric:        Mood and Affect: Mood normal.        Behavior: Behavior normal.      UC Treatments / Results  Labs (all labs ordered are listed, but only abnormal results are displayed) Labs Reviewed  CBC WITH DIFFERENTIAL/PLATELET  COMPREHENSIVE METABOLIC PANEL    EKG   Radiology No results found.  Procedures Procedures (including critical care time)  Medications Ordered in UC Medications - No data to display  Initial Impression / Assessment and Plan / UC Course  I have reviewed the triage vital signs and the nursing notes.  Pertinent labs & imaging results that were available during my care of the patient were reviewed by me and considered in my medical decision making (see chart for details).     Chest Pain Stress Reaction Insomnia Anxiety Panic Attack  EKG normal sinus rhythm in office today CBC and CMP pending Prescribed hydroxyzine 25mg  TID prn Provided information for counseling Discussed that chest pain is likely anxiety and stress related Cannot rule out PE in office, less likely given no history and clinical presentation If symptoms acutely  worsen, go to the ER for further evaluation and treatment Final Clinical Impressions(s) / UC Diagnoses   Final diagnoses:  Stress reaction  Insomnia, unspecified type  Chest pain, unspecified type  Anxiety state  Panic attack as reaction to stress     Discharge Instructions     We are checking labs for you  If there are any abnormal labs, we will be in touch and will treat as needed  I have sent in hydroxyzine for you to take up to three times per day as needed for anxiety and insomnia.  Follow up as needed    ED Prescriptions    Medication Sig Dispense Auth. Provider   hydrOXYzine (ATARAX/VISTARIL) 25 MG tablet Take 1 tablet (25 mg total) by mouth every 6 (six) hours. 30 tablet Moshe Cipro, NP     PDMP not reviewed this encounter.   Moshe Cipro, NP 04/26/20 2059

## 2020-04-26 NOTE — Discharge Instructions (Addendum)
We are checking labs for you  If there are any abnormal labs, we will be in touch and will treat as needed  I have sent in hydroxyzine for you to take up to three times per day as needed for anxiety and insomnia.  Follow up as needed

## 2020-09-25 ENCOUNTER — Other Ambulatory Visit: Payer: Self-pay

## 2020-09-25 ENCOUNTER — Encounter (HOSPITAL_BASED_OUTPATIENT_CLINIC_OR_DEPARTMENT_OTHER): Payer: Self-pay | Admitting: *Deleted

## 2020-09-25 ENCOUNTER — Emergency Department (HOSPITAL_BASED_OUTPATIENT_CLINIC_OR_DEPARTMENT_OTHER)
Admission: EM | Admit: 2020-09-25 | Discharge: 2020-09-25 | Disposition: A | Discharge status: Home / Self Care | Payer: 59 | Attending: Emergency Medicine | Admitting: Emergency Medicine

## 2020-09-25 DIAGNOSIS — X500XXA Overexertion from strenuous movement or load, initial encounter: Secondary | ICD-10-CM | POA: Insufficient documentation

## 2020-09-25 DIAGNOSIS — I10 Essential (primary) hypertension: Secondary | ICD-10-CM | POA: Insufficient documentation

## 2020-09-25 DIAGNOSIS — K649 Unspecified hemorrhoids: Secondary | ICD-10-CM | POA: Insufficient documentation

## 2020-09-25 DIAGNOSIS — S29011A Strain of muscle and tendon of front wall of thorax, initial encounter: Secondary | ICD-10-CM | POA: Diagnosis not present

## 2020-09-25 DIAGNOSIS — K625 Hemorrhage of anus and rectum: Secondary | ICD-10-CM

## 2020-09-25 DIAGNOSIS — T148XXA Other injury of unspecified body region, initial encounter: Secondary | ICD-10-CM

## 2020-09-25 DIAGNOSIS — S299XXA Unspecified injury of thorax, initial encounter: Secondary | ICD-10-CM | POA: Diagnosis present

## 2020-09-25 LAB — CBC WITH DIFFERENTIAL/PLATELET
Abs Immature Granulocytes: 0.01 10*3/uL (ref 0.00–0.07)
Basophils Absolute: 0 10*3/uL (ref 0.0–0.1)
Basophils Relative: 0 %
Eosinophils Absolute: 0.1 10*3/uL (ref 0.0–0.5)
Eosinophils Relative: 1 %
HCT: 43.1 % (ref 39.0–52.0)
Hemoglobin: 14 g/dL (ref 13.0–17.0)
Immature Granulocytes: 0 %
Lymphocytes Relative: 28 %
Lymphs Abs: 2.3 10*3/uL (ref 0.7–4.0)
MCH: 26 pg (ref 26.0–34.0)
MCHC: 32.5 g/dL (ref 30.0–36.0)
MCV: 80.1 fL (ref 80.0–100.0)
Monocytes Absolute: 0.6 10*3/uL (ref 0.1–1.0)
Monocytes Relative: 8 %
Neutro Abs: 5.2 10*3/uL (ref 1.7–7.7)
Neutrophils Relative %: 63 %
Platelets: 339 10*3/uL (ref 150–400)
RBC: 5.38 MIL/uL (ref 4.22–5.81)
RDW: 14.3 % (ref 11.5–15.5)
WBC: 8.1 10*3/uL (ref 4.0–10.5)
nRBC: 0 % (ref 0.0–0.2)

## 2020-09-25 LAB — COMPREHENSIVE METABOLIC PANEL
ALT: 21 U/L (ref 0–44)
AST: 28 U/L (ref 15–41)
Albumin: 4.9 g/dL (ref 3.5–5.0)
Alkaline Phosphatase: 49 U/L (ref 38–126)
Anion gap: 12 (ref 5–15)
BUN: 10 mg/dL (ref 6–20)
CO2: 24 mmol/L (ref 22–32)
Calcium: 9.3 mg/dL (ref 8.9–10.3)
Chloride: 105 mmol/L (ref 98–111)
Creatinine, Ser: 0.98 mg/dL (ref 0.61–1.24)
GFR, Estimated: >60 mL/min (ref >60)
Glucose, Bld: 105 mg/dL — ABNORMAL HIGH (ref 70–99)
Potassium: 3.8 mmol/L (ref 3.5–5.1)
Sodium: 141 mmol/L (ref 135–145)
Total Bilirubin: 0.6 mg/dL (ref 0.3–1.2)
Total Protein: 7.8 g/dL (ref 6.5–8.1)

## 2020-09-25 LAB — URINALYSIS, MICROSCOPIC (REFLEX)

## 2020-09-25 LAB — URINALYSIS, ROUTINE W REFLEX MICROSCOPIC
Bilirubin Urine: NEGATIVE
Glucose, UA: NEGATIVE mg/dL
Ketones, ur: NEGATIVE mg/dL
Leukocytes,Ua: NEGATIVE
Nitrite: NEGATIVE
Protein, ur: 30 mg/dL — AB
Specific Gravity, Urine: 1.03 (ref 1.005–1.030)
pH: 6 (ref 5.0–8.0)

## 2020-09-25 LAB — LIPASE, BLOOD: Lipase: 25 U/L (ref 11–51)

## 2020-09-25 MED ORDER — HYDROCORTISONE (PERIANAL) 2.5 % EX CREA: 1.0000 "application " | TOPICAL_CREAM | Freq: Two times a day (BID) | CUTANEOUS | 0 refills | Status: DC

## 2020-09-25 MED ORDER — IBUPROFEN 600 MG PO TABS: 600.0000 mg | ORAL_TABLET | Freq: Four times a day (QID) | ORAL | 0 refills | Status: DC | PRN

## 2020-09-25 MED ORDER — CYCLOBENZAPRINE HCL 10 MG PO TABS: 10.0000 mg | ORAL_TABLET | Freq: Two times a day (BID) | ORAL | 0 refills | Status: DC | PRN

## 2020-09-25 MED ORDER — KETOROLAC TROMETHAMINE 60 MG/2ML IM SOLN
60.0000 mg | Freq: Once | INTRAMUSCULAR | Status: AC
  Administered 2020-09-25: 60 mg via INTRAMUSCULAR
  Filled 2020-09-25: qty 2

## 2020-09-25 NOTE — ED Provider Notes (Signed)
[facility] EMERGENCY DEPARTMENT Provider Note   CSN: [id number] Arrival date & time: 09/25/20  1854     History Chief Complaint  Patient presents with  . Rectal Bleeding    Anthony Mullen is a 35 y.o. male with PMH significant for hemorrhoids who presents to the ED with complaints of rectal bleeding.  Patient has had multiple colonoscopies performed in the past which have demonstrated hemorrhoids and colonic polyps.   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  This morning while lifting a box he noticed right-sided flank discomfort.  He states that it typically goes away with rest, but returns when he is active and working.  He also has a 1.5-week history of BRBPR, particular when he defecates. He has not tried anything.  Patient denies any abdominal pain, nausea vomiting, inability eat or drink, fevers or chills, cough, shortness of breath, history of clots or clotting disorder, obvious precipitating injury, or other symptoms.  HPI     Past Medical History:  Diagnosis Date  . Hemorrhoids   . Hernia, abdominal   . Hypertension   . Substance abuse [facility])     Patient Active Problem List   Diagnosis Date Noted  . Depression (emotion) 12/13/2014    Past Surgical History:  Procedure Laterality Date  . COLONOSCOPY         Family History  Problem Relation Age of Onset  . Hypertension Mother   . Hypertension Maternal Grandmother   . Cancer Paternal Grandmother   . Heart failure Other        cousin/grandfather  . Breast cancer Other        male cousin/grandmother  . Heart attack Other        grandfather    Social History   Tobacco Use  . Smoking status: Never Smoker  . Smokeless tobacco: Never Used  Vaping Use  . Vaping Use: Never used  Substance Use Topics  . Alcohol use: Not Currently  . Drug use: No    Home Medications Prior to Admission medications   Medication Sig Start Date End  Date Taking? Authorizing Provider  cyclobenzaprine (FLEXERIL) 10 MG tablet Take 1 tablet (10 mg total) by mouth 2 (two) times daily as needed for muscle spasms. 09/25/20  Yes Lorelee New, PA-C  hydrocortisone (ANUSOL-HC) 2.5 % rectal cream Place 1 application rectally 2 (two) times daily. 09/25/20  Yes Lorelee New, PA-C  ibuprofen (ADVIL) 600 MG tablet Take 1 tablet (600 mg total) by mouth every 6 (six) hours as needed. 09/25/20  Yes Lorelee New, PA-C  hydrOXYzine (ATARAX/VISTARIL) 25 MG tablet Take 1 tablet (25 mg total) by mouth every 6 (six) hours. 04/26/20   Moshe Cipro, NP    Allergies    Benadryl [diphenhydramine hcl]  Review of Systems   Review of Systems  All other systems reviewed and are negative.   Physical Exam Updated Vital Signs BP 138/77   Pulse 62   Temp 99.2 F (37.3 C)   Resp 18   Ht 5\' 9"  (1.753 m)   Wt 117.9 kg   SpO2 97%   BMI 38.40 kg/m   Physical Exam Vitals and nursing note reviewed. Exam conducted with a chaperone present.  Constitutional:      Appearance: Normal appearance.  HENT:     Head: Normocephalic and atraumatic.  Eyes:     General: No scleral icterus.    Conjunctiva/sclera: Conjunctivae normal.  Cardiovascular:  Rate and Rhythm: Normal rate.     Pulses: Normal pulses.  Pulmonary:     Effort: Pulmonary effort is normal. No respiratory distress.     Comments: Lateral right-sided chest wall tenderness.  No overlying skin changes. Chest:     Chest wall: Tenderness present.  Abdominal:     General: Abdomen is flat. There is no distension.     Palpations: Abdomen is soft.     Tenderness: There is no abdominal tenderness. There is no guarding.     Comments: Negative Murphy and McBurney's point tenderness.  Genitourinary:    Comments: Small hemorrhoid noted, nonthrombosed.  No obvious BRBPR. Musculoskeletal:        General: Normal range of motion.  Skin:    General: Skin is dry.     Capillary Refill: Capillary  refill takes less than 2 seconds.  Neurological:     Mental Status: He is alert.     GCS: GCS eye subscore is 4. GCS verbal subscore is 5. GCS motor subscore is 6.  Psychiatric:        Mood and Affect: Mood normal.        Behavior: Behavior normal.        Thought Content: Thought content normal.     ED Results / Procedures / Treatments   Labs (all labs ordered are listed, but only abnormal results are displayed) Labs Reviewed  URINALYSIS, ROUTINE W REFLEX MICROSCOPIC - Abnormal; Notable for the following components:      Result Value   Hgb urine dipstick TRACE (*)    Protein, ur 30 (*)    All other components within normal limits  COMPREHENSIVE METABOLIC PANEL - Abnormal; Notable for the following components:   Glucose, Bld 105 (*)    All other components within normal limits  URINALYSIS, MICROSCOPIC (REFLEX) - Abnormal; Notable for the following components:   Bacteria, UA FEW (*)    All other components within normal limits  CBC WITH DIFFERENTIAL/PLATELET  LIPASE, BLOOD    EKG None  Radiology No results found.  Procedures Procedures   Medications Ordered in ED Medications  ketorolac (TORADOL) injection 60 mg (has no administration in time range)    ED Course  I have reviewed the triage vital signs and the nursing notes.  Pertinent labs & imaging results that were available during my care of the patient were reviewed by me and considered in my medical decision making (see chart for details).    MDM Rules/Calculators/A&P                          Anthony Mullen was evaluated in Emergency Department on 09/25/2020 for the symptoms described in the history of present illness. He was evaluated in the context of the global COVID-19 pandemic, which necessitated consideration that the patient might be at risk for infection with the SARS-CoV-2 virus that causes COVID-19. Institutional protocols and algorithms that pertain to the evaluation of patients at risk for COVID-19  are in a state of rapid change based on information released by regulatory bodies including the CDC and federal and state organizations. These policies and algorithms were followed during the patient's care in the ED.  I personally reviewed patient's medical chart and all notes from triage and staff during today's encounter. I have also ordered and reviewed all labs and imaging that I felt to be medically necessary in the evaluation of this patient's complaints and with consideration of their physical exam. If  needed, translation services were available and utilized.   Patient's right-sided flank/inferior chest wall discomfort is suggestive of musculoskeletal etiology from lifting boxes. Reproducible on physical exam with palpation. Also worse with certain movements. Lower suspicion for acute osseous abnormalities. Not feel as though plain films are warranted. Abdominal exam is also benign. Negative Murphy's and at Burney's point tenderness. He not feel as though CT missing is warranted. Patient is well-appearing. No significant discomfort. No history kidney stones. UA without hematuria.  CBC without evidence of anemia or leukocytosis concerning for infection.  Laboratory work-up is all reviewed entirely unremarkable.  Vital signs are stable and within normal limits.  Patient's rectal exam was unremarkable. Very small hemorrhoid noted. Will prescribe Anusol, but states that he can also benefit from over-the-counter generic brand.  Patient to follow-up with his primary care provider as well as his gastroenterologist for ongoing evaluation and management. ED return precautions discussed. Patient voices understanding is agreeable to the plan.  Final Clinical Impression(s) / ED Diagnoses Final diagnoses:  Rectal bleeding  Muscle strain    Rx / DC Orders ED Discharge Orders         Ordered    hydrocortisone (ANUSOL-HC) 2.5 % rectal cream  2 times daily        09/25/20 2150    ibuprofen (ADVIL) 600  MG tablet  Every 6 hours PRN        09/25/20 2150    cyclobenzaprine (FLEXERIL) 10 MG tablet  2 times daily PRN        09/25/20 2150           Lorelee New, PA-C 09/25/20 2151    Terald Sleeper, MD 09/26/20 1149

## 2020-09-25 NOTE — Discharge Instructions (Addendum)
Your history and physical exam is suggestive of muscle strain in addition to bright red blood per rectum likely in setting of internal hemorrhoids.  I recommend ibuprofen 600 mg every 6 hours as needed for muscle pain.  You were given a prescription for Flexeril which is a muscle relaxer.  You should not drive, work, consume alcohol, or operate machinery while taking this medication as it can make you very drowsy. You may also use topical agents such as icy hot.  I have also prescribed you Anusol which you can use for your hemorrhoids. I encourage increased oral hydration and high fiber diet.  Follow-up with your primary care provider as well as your gastroenterologist for ongoing evaluation and management.  Return to the ED or seek immediate medical attention should you experience any new worsening symptoms.

## 2020-09-25 NOTE — ED Triage Notes (Signed)
C/o rectal bleeding x 1 week also c/o right abd pain x 1 day

## 2020-12-15 ENCOUNTER — Emergency Department (HOSPITAL_BASED_OUTPATIENT_CLINIC_OR_DEPARTMENT_OTHER)
Admission: EM | Admit: 2020-12-15 | Discharge: 2020-12-15 | Disposition: A | Discharge status: Home / Self Care | Payer: 59 | Attending: Emergency Medicine | Admitting: Emergency Medicine

## 2020-12-15 ENCOUNTER — Other Ambulatory Visit: Payer: Self-pay

## 2020-12-15 ENCOUNTER — Encounter (HOSPITAL_BASED_OUTPATIENT_CLINIC_OR_DEPARTMENT_OTHER): Payer: Self-pay | Admitting: Emergency Medicine

## 2020-12-15 DIAGNOSIS — I10 Essential (primary) hypertension: Secondary | ICD-10-CM | POA: Insufficient documentation

## 2020-12-15 DIAGNOSIS — K625 Hemorrhage of anus and rectum: Secondary | ICD-10-CM | POA: Diagnosis not present

## 2020-12-15 DIAGNOSIS — R109 Unspecified abdominal pain: Secondary | ICD-10-CM | POA: Diagnosis present

## 2020-12-15 LAB — CBC WITH DIFFERENTIAL/PLATELET
Abs Immature Granulocytes: 0.02 10*3/uL (ref 0.00–0.07)
Basophils Absolute: 0 10*3/uL (ref 0.0–0.1)
Basophils Relative: 1 %
Eosinophils Absolute: 0.3 10*3/uL (ref 0.0–0.5)
Eosinophils Relative: 4 %
HCT: 43.2 % (ref 39.0–52.0)
Hemoglobin: 14.2 g/dL (ref 13.0–17.0)
Immature Granulocytes: 0 %
Lymphocytes Relative: 29 %
Lymphs Abs: 1.7 10*3/uL (ref 0.7–4.0)
MCH: 26 pg (ref 26.0–34.0)
MCHC: 32.9 g/dL (ref 30.0–36.0)
MCV: 79 fL — ABNORMAL LOW (ref 80.0–100.0)
Monocytes Absolute: 0.7 10*3/uL (ref 0.1–1.0)
Monocytes Relative: 12 %
Neutro Abs: 3.1 10*3/uL (ref 1.7–7.7)
Neutrophils Relative %: 54 %
Platelets: 332 10*3/uL (ref 150–400)
RBC: 5.47 MIL/uL (ref 4.22–5.81)
RDW: 14.3 % (ref 11.5–15.5)
WBC: 5.9 10*3/uL (ref 4.0–10.5)
nRBC: 0 % (ref 0.0–0.2)

## 2020-12-15 LAB — COMPREHENSIVE METABOLIC PANEL
ALT: 21 U/L (ref 0–44)
AST: 25 U/L (ref 15–41)
Albumin: 4.3 g/dL (ref 3.5–5.0)
Alkaline Phosphatase: 49 U/L (ref 38–126)
Anion gap: 9 (ref 5–15)
BUN: 12 mg/dL (ref 6–20)
CO2: 24 mmol/L (ref 22–32)
Calcium: 9.2 mg/dL (ref 8.9–10.3)
Chloride: 105 mmol/L (ref 98–111)
Creatinine, Ser: 0.98 mg/dL (ref 0.61–1.24)
GFR, Estimated: >60 mL/min (ref >60)
Glucose, Bld: 99 mg/dL (ref 70–99)
Potassium: 4.2 mmol/L (ref 3.5–5.1)
Sodium: 138 mmol/L (ref 135–145)
Total Bilirubin: 0.3 mg/dL (ref 0.3–1.2)
Total Protein: 7.8 g/dL (ref 6.5–8.1)

## 2020-12-15 LAB — LIPASE, BLOOD: Lipase: 24 U/L (ref 11–51)

## 2020-12-15 MED ORDER — PANTOPRAZOLE SODIUM 40 MG PO TBEC
40.0000 mg | DELAYED_RELEASE_TABLET | Freq: Once | ORAL | Status: AC
  Administered 2020-12-15: 40 mg via ORAL
  Filled 2020-12-15: qty 1

## 2020-12-15 NOTE — ED Triage Notes (Signed)
Pt reports abdominal pain with rectal bleeding x 4 days.

## 2020-12-15 NOTE — ED Provider Notes (Signed)
[facility] EMERGENCY DEPARTMENT Provider Note   CSN: [id number] Arrival date & time: 12/15/20  4174     History Chief Complaint  Patient presents with  . Abdominal Pain    Anthony Mullen is a 35 y.o. male.  HPI Patient is a 35 year old male with a history of hemorrhoids, hypertension, substance abuse, who presents the emergency department due to rectal bleeding.  Patient states he began experiencing rectal bleeding about 4 to 5 days ago.  He feels as if his symptoms are worsening.  He states it started out with blood covering brown stools and has progressed to watery blood throughout the toilet bowl.  He states this morning he also had melena in addition to his bloody stools.  Also reports intermittent left-sided abdominal pain.  He states that is typically worse with activity.  Denies any chest pain, shortness of breath, lightheadedness, syncope, urinary complaints.  Denies regular ETOH or NSAID use.  Upon further discussion patient notes that he drank Pepto-Bismol yesterday and used this intermittently.  Per records, patient has a history of hemorrhoids as well as rectal bleeding.  He has had 2 colonoscopies at [facility] in the past 4 years.  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  They are considering the possibility of Crohn's disease but feel this is less likely.    Past Medical History:  Diagnosis Date  . Hemorrhoids   . Hernia, abdominal   . Hypertension   . Substance abuse [facility])     Patient Active Problem List   Diagnosis Date Noted  . Depression (emotion) 12/13/2014    Past Surgical History:  Procedure Laterality Date  . COLONOSCOPY         Family History  Problem Relation Age of Onset  . Hypertension Mother   . Hypertension Maternal Grandmother   . Cancer Paternal Grandmother   . Heart failure Other        cousin/grandfather   . Breast cancer Other        male cousin/grandmother  . Heart attack Other        grandfather    Social History   Tobacco Use  . Smoking status: Never Smoker  . Smokeless tobacco: Never Used  Vaping Use  . Vaping Use: Never used  Substance Use Topics  . Alcohol use: Yes    Comment: socially  . Drug use: No    Home Medications Prior to Admission medications   Medication Sig Start Date End Date Taking? Authorizing Provider  lamoTRIgine (LAMICTAL) 25 MG tablet Take 50 mg by mouth daily. 12/11/20   [provider]    Allergies    Benadryl [diphenhydramine hcl]  Review of Systems   Review of Systems  All other systems reviewed and are negative. Ten systems reviewed and are negative for acute change, except as noted in the HPI.   Physical Exam Updated Vital Signs BP (!) 139/99 (BP Location: Right Arm)   Pulse 86   Temp 98.4 F (36.9 C) (Oral)   Resp 20   Ht 5\' 9"  (1.753 m)   Wt 122.5 kg   SpO2 97%   BMI 39.87 kg/m   Physical Exam Vitals and nursing note reviewed.  Constitutional:      General: He is not in acute distress.    Appearance: Normal appearance. He is well-developed. He is not ill-appearing, toxic-appearing or diaphoretic.  HENT:  Head: Normocephalic and atraumatic.     Right Ear: External ear normal.     Left Ear: External ear normal.     Nose: Nose normal.     Mouth/Throat:     Mouth: Mucous membranes are moist.     Pharynx: Oropharynx is clear. No oropharyngeal exudate or posterior oropharyngeal erythema.  Eyes:     Extraocular Movements: Extraocular movements intact.  Cardiovascular:     Rate and Rhythm: Normal rate and regular rhythm.     Pulses: Normal pulses.     Heart sounds: Normal heart sounds. No murmur heard. No friction rub. No gallop.      Comments: Regular rate and rhythm without murmurs rubs or gallops. Pulmonary:     Effort: Pulmonary effort is normal. No respiratory distress.     Breath sounds: Normal breath  sounds. No stridor. No wheezing, rhonchi or rales.     Comments: Lungs are clear to auscultation bilaterally. Abdominal:     General: Abdomen is flat and protuberant.     Palpations: Abdomen is soft.     Tenderness: There is no abdominal tenderness.     Comments: Protuberant abdomen that is soft and nontender in all 4 quadrants.  Negative Murphy sign.  No McBurney's point tenderness.  No flank pain.  Genitourinary:    Comments: Nursing chaperone present.  2 external nonthrombosed hemorrhoids noted.  No visible external bleeding noted around the anus.  No tenderness overlying the hemorrhoids.  Multiple palpable internal hemorrhoids noted as well.  No blood, melena, or stool appreciated in the rectal vault.  No tenderness noted throughout the exam.  Musculoskeletal:        General: Normal range of motion.     Cervical back: Normal range of motion and neck supple. No tenderness.  Skin:    General: Skin is warm and dry.  Neurological:     General: No focal deficit present.     Mental Status: He is alert and oriented to person, place, and time.  Psychiatric:        Mood and Affect: Mood normal.        Behavior: Behavior normal.    ED Results / Procedures / Treatments   Labs (all labs ordered are listed, but only abnormal results are displayed) Labs Reviewed  CBC WITH DIFFERENTIAL/PLATELET - Abnormal; Notable for the following components:      Result Value   MCV 79.0 (*)    All other components within normal limits  COMPREHENSIVE METABOLIC PANEL  LIPASE, BLOOD   EKG None  Radiology No results found.  Procedures Procedures   Medications Ordered in ED Medications  pantoprazole (PROTONIX) EC tablet 40 mg (40 mg Oral Given 12/15/20 1033)    ED Course  I have reviewed the triage vital signs and the nursing notes.  Pertinent labs & imaging results that were available during my care of the patient were reviewed by me and considered in my medical decision making (see chart for  details).    MDM Rules/Calculators/A&P                          Pt is a 35 y.o. male who presents the emergency department with intermittent left-sided abdominal pain as well as rectal bleeding/melena for the past 4 to 5 days.  Labs: CBC with an MCV of 79. CMP within normal limits. Lipase within normal limits at 24.  I, Placido Sou, PA-C, personally reviewed and evaluated these images and  lab results as part of my medical decision-making.  Patient has a lengthy history of similar symptoms.  He has been seen by GI multiple times in the past and has had 2 colonoscopies as well at [facility].  Lab work today is all reassuring.  Hemoglobin within normal limits.  Patient is having no tenderness on his abdominal exam.  No Murphy sign or McBurney's point tenderness.  No tenderness appreciated along the left abdomen.  No flank pain.  No melena, hematochezia or stool was noted in the rectal vault.  He was non tender throughout the rectal exam.  He did have visible external hemorrhoids that were non thrombosed as well as palpable internal hemorrhoids.  These are possibly the source of his bleeding.  He also notes that he uses Pepto-Bismol intermittently and took a dose yesterday evening which likely explains his darkened stools this morning.  Did not feel that repeat CT scan was warranted at this time and he was agreeable.  Patient requests referral to a new gastroenterologist.  Has been seen at [facility] as well as [facility] in the past.  Will give him a referral to [facility].   Feel the patient is stable for discharge at this time and he is agreeable.  His questions were answered and he was amicable at the time of discharge.  Note: Portions of this report may have been transcribed using voice recognition software. Every effort was made to ensure accuracy; however, inadvertent computerized transcription errors may be present.   Final Clinical Impression(s) / ED Diagnoses Final diagnoses:  Rectal  bleeding   Rx / DC Orders ED Discharge Orders    None       Placido Sou, PA-C 12/15/20 1127    Linwood Dibbles, MD 12/16/20 [id number]

## 2020-12-15 NOTE — Discharge Instructions (Addendum)
Please continue to monitor your symptoms closely.  I think that your Pepto-Bismol use is what likely caused your darkened stools this morning.  I have given you a referral to [facility] gastroenterology.  Please give them a call as soon as possible to schedule a follow-up appointment.  If you find your symptoms worsen and you develop fevers, worsening abdominal pain, worsening rectal bleeding, shortness of breath, lightheadedness, fatigue, please return to the emergency department immediately for reevaluation.  It was a pleasure to meet you.

## 2020-12-15 NOTE — ED Notes (Signed)
Pt discharged to home. Discharge instructions have been discussed with patient and/or family members. Pt verbally acknowledges understanding d/c instructions, and endorses comprehension to checkout at registration before leaving.  °

## 2021-12-27 ENCOUNTER — Encounter (HOSPITAL_BASED_OUTPATIENT_CLINIC_OR_DEPARTMENT_OTHER): Payer: Self-pay

## 2021-12-27 ENCOUNTER — Emergency Department (HOSPITAL_BASED_OUTPATIENT_CLINIC_OR_DEPARTMENT_OTHER)
Admission: EM | Admit: 2021-12-27 | Discharge: 2021-12-27 | Disposition: A | Discharge status: Home / Self Care | Payer: 59 | Attending: Emergency Medicine | Admitting: Emergency Medicine

## 2021-12-27 ENCOUNTER — Other Ambulatory Visit: Payer: Self-pay

## 2021-12-27 ENCOUNTER — Emergency Department (HOSPITAL_BASED_OUTPATIENT_CLINIC_OR_DEPARTMENT_OTHER): Payer: 59

## 2021-12-27 DIAGNOSIS — R1032 Left lower quadrant pain: Secondary | ICD-10-CM | POA: Diagnosis present

## 2021-12-27 DIAGNOSIS — K529 Noninfective gastroenteritis and colitis, unspecified: Secondary | ICD-10-CM

## 2021-12-27 DIAGNOSIS — K644 Residual hemorrhoidal skin tags: Secondary | ICD-10-CM | POA: Insufficient documentation

## 2021-12-27 DIAGNOSIS — K640 First degree hemorrhoids: Secondary | ICD-10-CM

## 2021-12-27 HISTORY — DX: Bipolar disorder, unspecified: F31.9

## 2021-12-27 LAB — COMPREHENSIVE METABOLIC PANEL
ALT: 22 U/L (ref 0–44)
AST: 24 U/L (ref 15–41)
Albumin: 4.5 g/dL (ref 3.5–5.0)
Alkaline Phosphatase: 54 U/L (ref 38–126)
Anion gap: 8 (ref 5–15)
BUN: 13 mg/dL (ref 6–20)
CO2: 25 mmol/L (ref 22–32)
Calcium: 9.5 mg/dL (ref 8.9–10.3)
Chloride: 105 mmol/L (ref 98–111)
Creatinine, Ser: 1 mg/dL (ref 0.61–1.24)
GFR, Estimated: >60 mL/min (ref >60)
Glucose, Bld: 124 mg/dL — ABNORMAL HIGH (ref 70–99)
Potassium: 3.6 mmol/L (ref 3.5–5.1)
Sodium: 138 mmol/L (ref 135–145)
Total Bilirubin: 0.7 mg/dL (ref 0.3–1.2)
Total Protein: 8.2 g/dL — ABNORMAL HIGH (ref 6.5–8.1)

## 2021-12-27 LAB — CBC WITH DIFFERENTIAL/PLATELET
Abs Immature Granulocytes: 0.02 10*3/uL (ref 0.00–0.07)
Basophils Absolute: 0 10*3/uL (ref 0.0–0.1)
Basophils Relative: 0 %
Eosinophils Absolute: 0.1 10*3/uL (ref 0.0–0.5)
Eosinophils Relative: 1 %
HCT: 43.5 % (ref 39.0–52.0)
Hemoglobin: 14.2 g/dL (ref 13.0–17.0)
Immature Granulocytes: 0 %
Lymphocytes Relative: 26 %
Lymphs Abs: 1.9 10*3/uL (ref 0.7–4.0)
MCH: 25.1 pg — ABNORMAL LOW (ref 26.0–34.0)
MCHC: 32.6 g/dL (ref 30.0–36.0)
MCV: 76.9 fL — ABNORMAL LOW (ref 80.0–100.0)
Monocytes Absolute: 0.5 10*3/uL (ref 0.1–1.0)
Monocytes Relative: 6 %
Neutro Abs: 5 10*3/uL (ref 1.7–7.7)
Neutrophils Relative %: 67 %
Platelets: 412 10*3/uL — ABNORMAL HIGH (ref 150–400)
RBC: 5.66 MIL/uL (ref 4.22–5.81)
RDW: 14.6 % (ref 11.5–15.5)
WBC: 7.5 10*3/uL (ref 4.0–10.5)
nRBC: 0 % (ref 0.0–0.2)

## 2021-12-27 LAB — OCCULT BLOOD X 1 CARD TO LAB, STOOL: Fecal Occult Bld: NEGATIVE

## 2021-12-27 MED ORDER — AMOXICILLIN-POT CLAVULANATE 875-125 MG PO TABS
1.0000 | ORAL_TABLET | Freq: Once | ORAL | Status: AC
  Administered 2021-12-27: 1 via ORAL
  Filled 2021-12-27: qty 1

## 2021-12-27 MED ORDER — SODIUM CHLORIDE 0.9 % IV BOLUS
1000.0000 mL | Freq: Once | INTRAVENOUS | Status: AC
  Administered 2021-12-27: 1000 mL via INTRAVENOUS

## 2021-12-27 MED ORDER — IOHEXOL 300 MG/ML  SOLN
100.0000 mL | Freq: Once | INTRAMUSCULAR | Status: AC | PRN
  Administered 2021-12-27: 100 mL via INTRAVENOUS

## 2021-12-27 MED ORDER — AMOXICILLIN-POT CLAVULANATE 875-125 MG PO TABS: 1.0000 | ORAL_TABLET | Freq: Two times a day (BID) | ORAL | 0 refills | Status: AC

## 2021-12-27 NOTE — Discharge Instructions (Addendum)
Take augmentin twice daily for a week for colitis  See your GI doctor as scheduled. You may need another colonoscopy   Your bleeding may also be from hemorrhoids. Use anusol cream.   Return to ER if you have worse abdominal pain, blood in stool, fever.

## 2021-12-27 NOTE — ED Triage Notes (Signed)
Pt c/o passing blood in stool. Pt states this is a common occurrence, but today the bleeding has been going on for a longer duration and now has lower abd pain.

## 2021-12-27 NOTE — ED Notes (Signed)
ED Provider at bedside. 

## 2021-12-27 NOTE — ED Notes (Addendum)
Pt discharged to home. Discharge instructions have been discussed with patient and/or family members. Pt verbally acknowledges understanding d/c instructions, and endorses comprehension to checkout at registration before leaving.  °

## 2021-12-27 NOTE — ED Provider Notes (Signed)
[facility] EMERGENCY DEPARTMENT Provider Note   CSN: [id number] Arrival date & time: 12/27/21  1738     History  Chief Complaint  Patient presents with   Rectal Bleeding    Anthony Mullen is a 36 y.o. male who presented with rectal bleeding.  Patient that he has intermittent rectal bleeding for last several years now.  He already had 2 colonoscopies and was told by his GI doctor that he has IBS.  Patient states that he has rectal bleeding usually for a week at the time.  However this time he was bleeding for a week and started having left lower quadrant pain.  He states that he has history of hemorrhoids and is not currently on any medicines for it.  Is not currently on any blood thinners.   The history is provided by the patient.      Home Medications Prior to Admission medications   Medication Sig Start Date End Date Taking? Authorizing Provider  lamoTRIgine (LAMICTAL) 25 MG tablet Take 50 mg by mouth daily. 12/11/20   [provider]      Allergies    Benadryl [diphenhydramine hcl]    Review of Systems   Review of Systems  Gastrointestinal:  Positive for blood in stool.  All other systems reviewed and are negative.  Physical Exam Updated Vital Signs BP (!) 145/89 (BP Location: Left Arm)   Pulse 100   Temp 98.6 F (37 C) (Oral)   Resp 20   Ht 5\' 9"  (1.753 m)   Wt 127 kg   SpO2 96%   BMI 41.35 kg/m  Physical Exam Vitals and nursing note reviewed.  Constitutional:      Appearance: Normal appearance.  HENT:     Head: Normocephalic.     Nose: Nose normal.     Mouth/Throat:     Mouth: Mucous membranes are moist.  Eyes:     Extraocular Movements: Extraocular movements intact.     Pupils: Pupils are equal, round, and reactive to light.  Cardiovascular:     Rate and Rhythm: Normal rate and regular rhythm.     Pulses: Normal pulses.     Heart sounds: Normal heart sounds.  Pulmonary:     Effort: Pulmonary effort is normal.     Breath sounds:  Normal breath sounds.  Abdominal:     General: Abdomen is flat.     Palpations: Abdomen is soft.  Genitourinary:    Comments: Rectal- two small external hemorrhoids, no active bleeding, no signs of thrombosed hemorrhoid  Musculoskeletal:     Cervical back: Normal range of motion and neck supple.  Skin:    General: Skin is warm.     Capillary Refill: Capillary refill takes less than 2 seconds.  Neurological:     General: No focal deficit present.     Mental Status: He is alert and oriented to person, place, and time.  Psychiatric:        Mood and Affect: Mood normal.        Behavior: Behavior normal.    ED Results / Procedures / Treatments   Labs (all labs ordered are listed, but only abnormal results are displayed) Labs Reviewed  CBC WITH DIFFERENTIAL/PLATELET  COMPREHENSIVE METABOLIC PANEL  OCCULT BLOOD X 1 CARD TO LAB, STOOL    EKG None  Radiology No results found.  Procedures Procedures    Medications Ordered in ED Medications  sodium chloride 0.9 % bolus 1,000 mL (has no administration in time range)  ED Course/ Medical Decision Making/ A&P                           Medical Decision Making Anthony Mullen is a 36 y.o. male here presenting with rectal bleeding.  Patient already saw [facility] and had 2 colonoscopies.  Consider IBS versus IBD.  Also consider bleeding from his external hemorrhoids.  Since patient has left lower quadrant pain I will also consider diverticulosis.  Will order CBC and CMP and guaiac and CT abdomen pelvis.  Will hydrate and reassess.   7:55 PM I reviewed patient's labs and independently reviewed patient's imaging.  White blood cell count is normal and hemoglobin is 14.  Patient's guaiac is negative.  CT abdomen pelvis showed mural thickening at the terminal ileum and thickened sigmoid colon concerning for possible enterocolitis versus inflammatory bowel disease.  We will give a course of Augmentin.  He states that he has a GI follow-up  scheduled next week already.  Told him to see GI and he may need another colonoscopy with a biopsy  Problems Addressed: Colitis: acute illness or injury  Amount and/or Complexity of Data Reviewed Labs: ordered. Decision-making details documented in ED Course. Radiology: ordered and independent interpretation performed. Decision-making details documented in ED Course.  Risk Prescription drug management.    Final Clinical Impression(s) / ED Diagnoses Final diagnoses:  None    Rx / DC Orders ED Discharge Orders     None         Charlynne Pander, MD 12/27/21 1956

## 2023-03-05 IMAGING — CT CT ABD-PELV W/ CM
2 of 4 series · 16 of 46 positions shown, 18 images · IV contrast (Omnipaque)
Comparison: 08/27/2019

CLINICAL DATA: Left lower quadrant abdominal pain, blood in stool

EXAM:
CT ABDOMEN AND PELVIS WITH CONTRAST
TECHNIQUE: Multidetector CT imaging of the abdomen and pelvis was performed
using the standard protocol following bolus administration of
intravenous contrast.

[Series 2: axial st · axial · 0.94mm/px · z∈[-516,-36]mm · 13 of 106 slices shown, 15 images]
[im 5/106  soft-tissue]
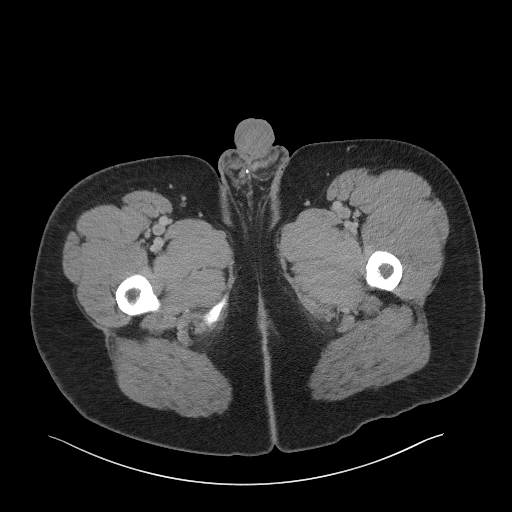
[im 5/106  bone]
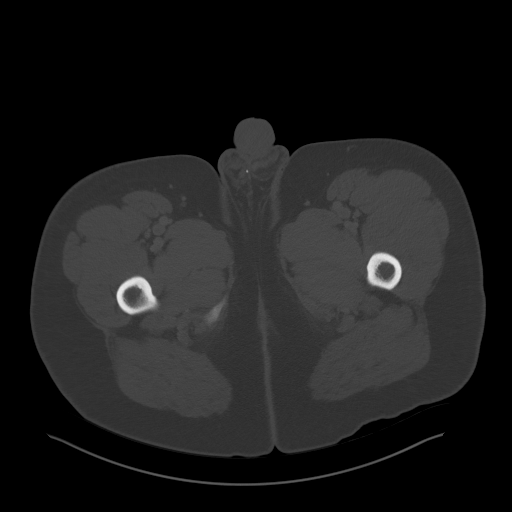
[im 15/106  soft-tissue]
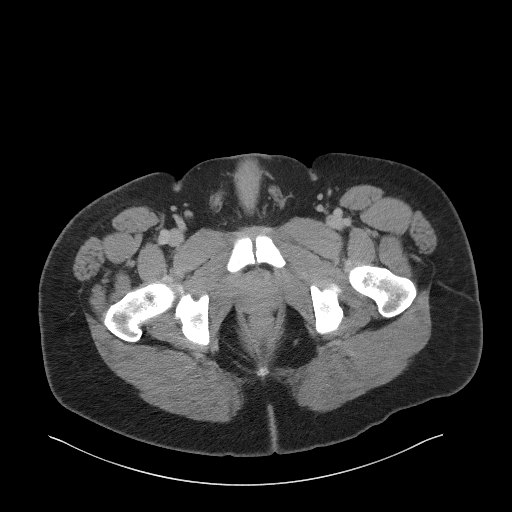
[im 24/106  soft-tissue]
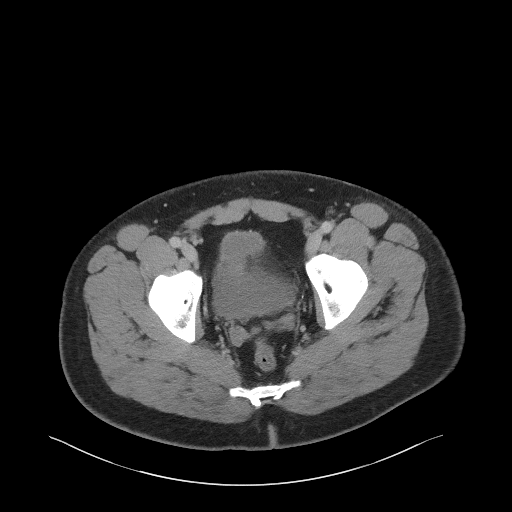
[im 29/106  soft-tissue]
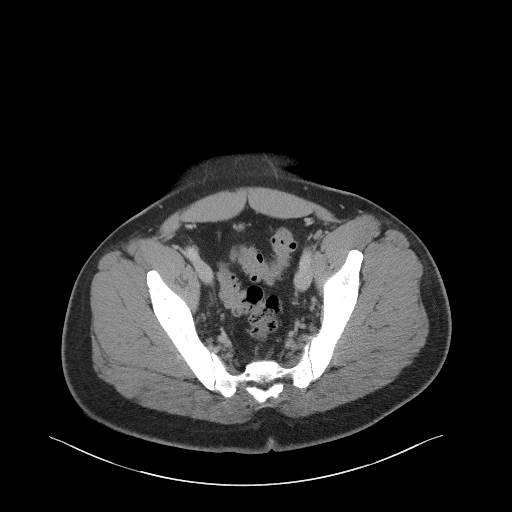
[im 39/106  soft-tissue]
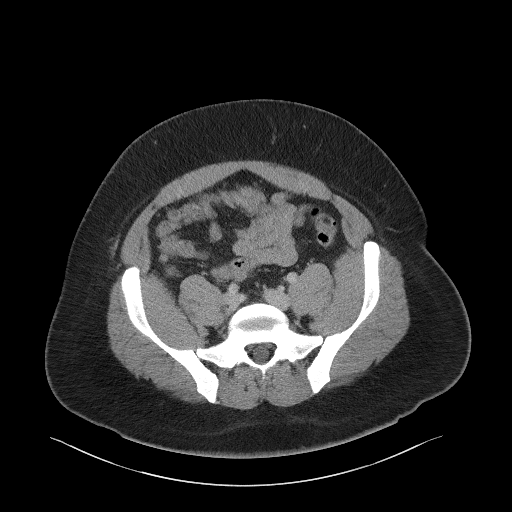
[im 43/106  soft-tissue]
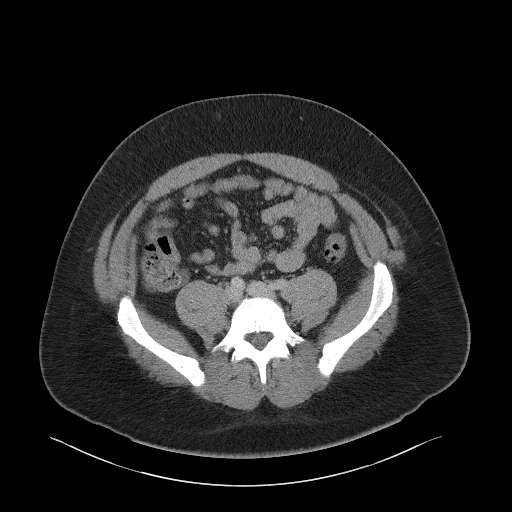
[im 53/106  soft-tissue]
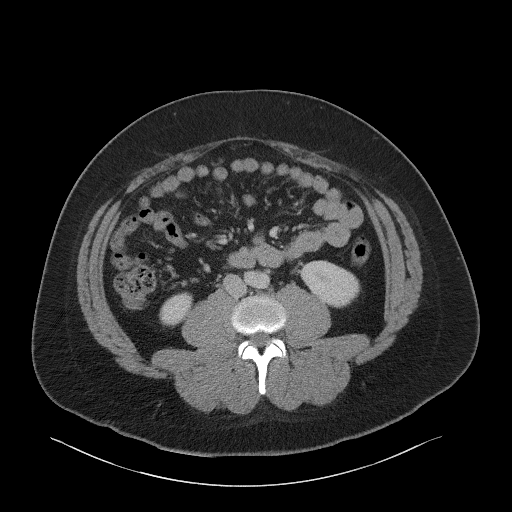
[im 63/106  soft-tissue]
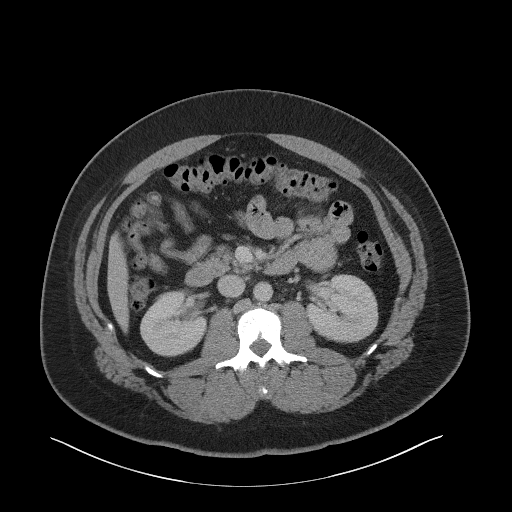
[im 67/106  soft-tissue]
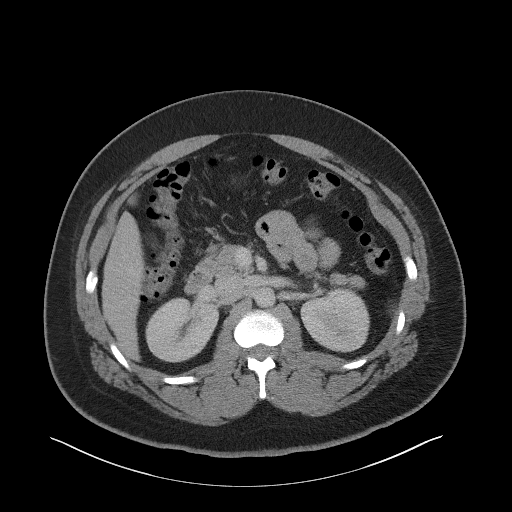
[im 67/106  bone]
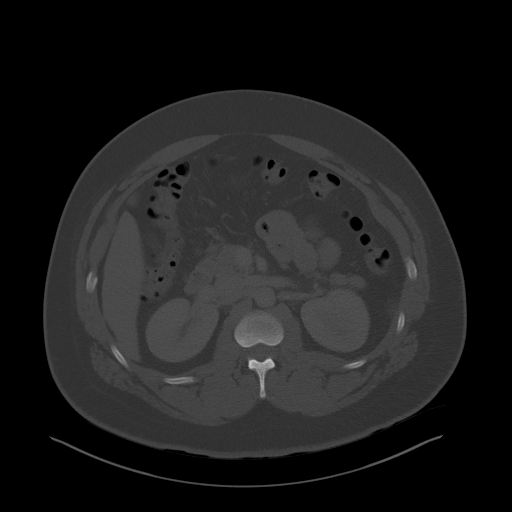
[im 77/106  soft-tissue]
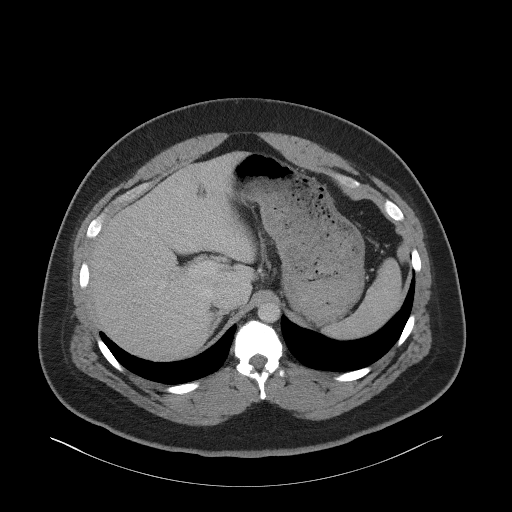
[im 82/106  soft-tissue]
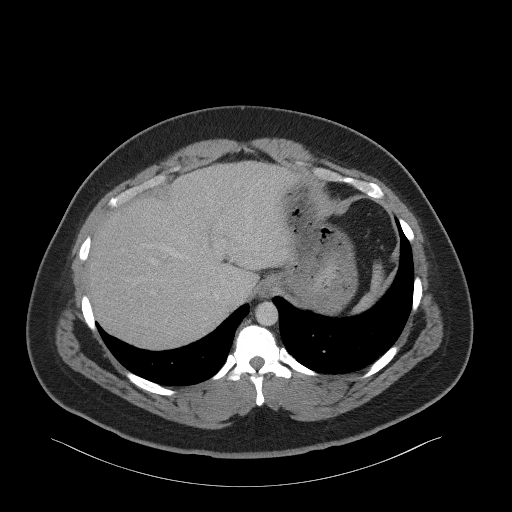
[im 91/106  soft-tissue]
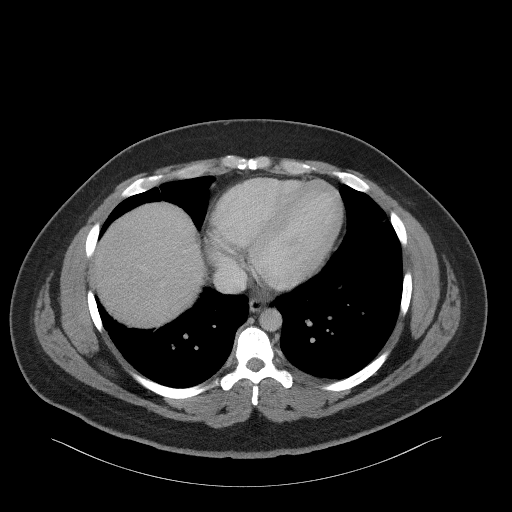
[im 101/106  soft-tissue]
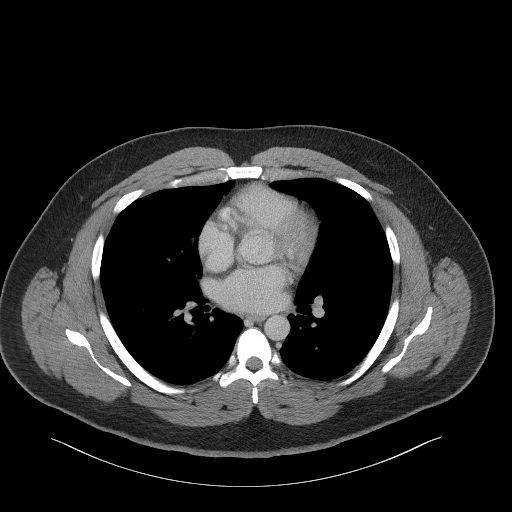

[Series 5: coronal st · coronal · 0.99mm/px · 3 of 124 slices shown]
[im 42/124  soft-tissue]
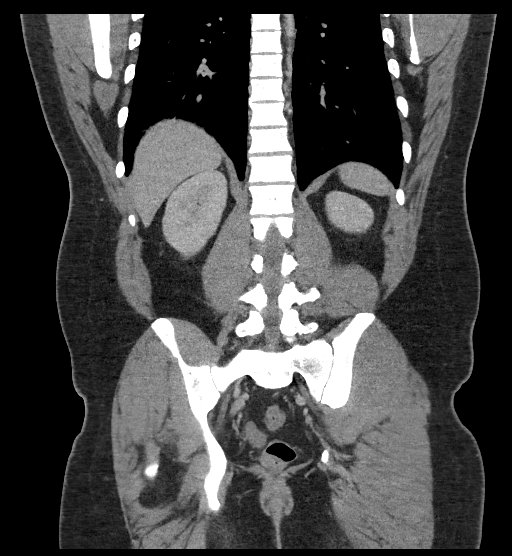
[im 55/124  soft-tissue]
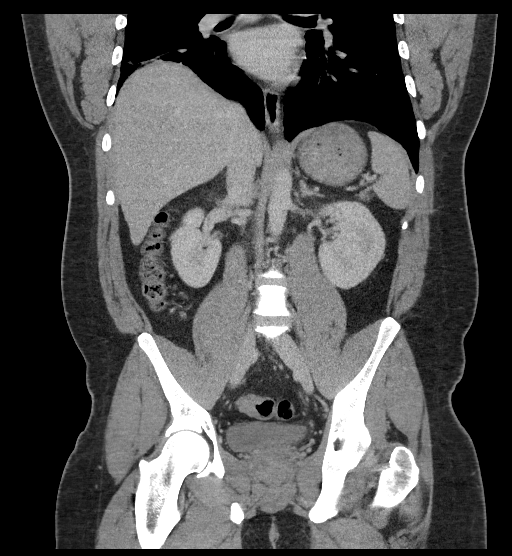
[im 69/124  soft-tissue]
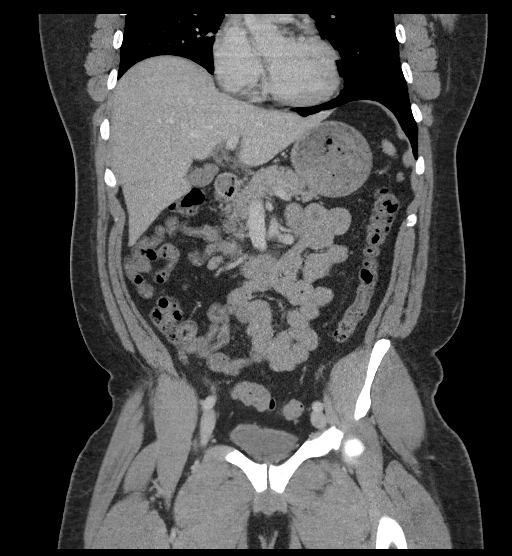

[16 of 46 positions shown; findings below may reference images not displayed]

RADIATION DOSE REDUCTION: This exam was performed according to the
departmental dose-optimization program which includes automated
exposure control, adjustment of the mA and/or kV according to
patient size and/or use of iterative reconstruction technique.

CONTRAST:  100mL OMNIPAQUE IOHEXOL 300 MG/ML  SOLN
FINDINGS: Lower chest: No acute abnormality.

Hepatobiliary: No focal liver abnormality is seen. No gallstones,
gallbladder wall thickening, or biliary dilatation.

Pancreas: Unremarkable. No pancreatic ductal dilatation or
surrounding inflammatory changes.

Spleen: Normal in size without focal abnormality.

Adrenals/Urinary Tract: Unremarkable adrenal glands. Kidneys enhance
symmetrically without focal lesion, stone, or hydronephrosis.
Ureters are nondilated. Urinary bladder appears unremarkable the
degree of distension.

Stomach/Bowel: Stomach is unremarkable. No dilated loops of bowel.
Mural thickening and submucosal fat deposition within the small
bowel, most notably at the terminal ileum. Sigmoid colon is mildly
thickened. No pericolonic inflammatory changes. A normal appendix is
present in the right lower quadrant.

Vascular/Lymphatic: No significant vascular findings are present. No
enlarged abdominal or pelvic lymph nodes.

Reproductive: Prostate is unremarkable.

Other: No free fluid. No abdominopelvic fluid collection. No
pneumoperitoneum. No abdominal wall hernia.

Musculoskeletal: No acute or significant osseous findings.
IMPRESSION: 1. Mural thickening and submucosal fat deposition within the small
bowel, most notably at the terminal ileum. Sigmoid colon is mildly
thickened. No pericolonic inflammatory changes. Findings are
suggestive of a nonspecific enterocolitis with differential
including infectious etiologies and inflammatory bowel disease.
2. Normal appendix.

## 2024-02-28 ENCOUNTER — Other Ambulatory Visit: Payer: Self-pay

## 2024-02-28 ENCOUNTER — Emergency Department (HOSPITAL_BASED_OUTPATIENT_CLINIC_OR_DEPARTMENT_OTHER)
Admission: EM | Admit: 2024-02-28 | Discharge: 2024-02-28 | Disposition: A | Discharge status: Home / Self Care | Attending: Emergency Medicine | Admitting: Emergency Medicine

## 2024-02-28 ENCOUNTER — Emergency Department (HOSPITAL_BASED_OUTPATIENT_CLINIC_OR_DEPARTMENT_OTHER)

## 2024-02-28 DIAGNOSIS — R202 Paresthesia of skin: Secondary | ICD-10-CM | POA: Insufficient documentation

## 2024-02-28 DIAGNOSIS — R059 Cough, unspecified: Secondary | ICD-10-CM | POA: Insufficient documentation

## 2024-02-28 DIAGNOSIS — I10 Essential (primary) hypertension: Secondary | ICD-10-CM | POA: Diagnosis not present

## 2024-02-28 DIAGNOSIS — R079 Chest pain, unspecified: Secondary | ICD-10-CM | POA: Diagnosis not present

## 2024-02-28 DIAGNOSIS — R051 Acute cough: Secondary | ICD-10-CM

## 2024-02-28 LAB — BASIC METABOLIC PANEL WITH GFR
Anion gap: 16 — ABNORMAL HIGH (ref 5–15)
BUN: 9 mg/dL (ref 6–20)
CO2: 21 mmol/L — ABNORMAL LOW (ref 22–32)
Calcium: 9.6 mg/dL (ref 8.9–10.3)
Chloride: 104 mmol/L (ref 98–111)
Creatinine, Ser: 0.84 mg/dL (ref 0.61–1.24)
GFR, Estimated: >60 mL/min (ref >60)
Glucose, Bld: 97 mg/dL (ref 70–99)
Potassium: 4.1 mmol/L (ref 3.5–5.1)
Sodium: 140 mmol/L (ref 135–145)

## 2024-02-28 LAB — RESP PANEL BY RT-PCR (RSV, FLU A&B, COVID)  RVPGX2
Influenza A by PCR: NEGATIVE
Influenza B by PCR: NEGATIVE
Resp Syncytial Virus by PCR: NEGATIVE
SARS Coronavirus 2 by RT PCR: NEGATIVE

## 2024-02-28 LAB — CBC WITH DIFFERENTIAL/PLATELET
Abs Immature Granulocytes: 0.01 K/uL (ref 0.00–0.07)
Basophils Absolute: 0 K/uL (ref 0.0–0.1)
Basophils Relative: 0 %
Eosinophils Absolute: 0.1 K/uL (ref 0.0–0.5)
Eosinophils Relative: 2 %
HCT: 43.7 % (ref 39.0–52.0)
Hemoglobin: 14.3 g/dL (ref 13.0–17.0)
Immature Granulocytes: 0 %
Lymphocytes Relative: 36 %
Lymphs Abs: 2.5 K/uL (ref 0.7–4.0)
MCH: 25.7 pg — ABNORMAL LOW (ref 26.0–34.0)
MCHC: 32.7 g/dL (ref 30.0–36.0)
MCV: 78.5 fL — ABNORMAL LOW (ref 80.0–100.0)
Monocytes Absolute: 0.6 K/uL (ref 0.1–1.0)
Monocytes Relative: 9 %
Neutro Abs: 3.6 K/uL (ref 1.7–7.7)
Neutrophils Relative %: 53 %
Platelets: 332 K/uL (ref 150–400)
RBC: 5.57 MIL/uL (ref 4.22–5.81)
RDW: 14.6 % (ref 11.5–15.5)
WBC: 6.8 K/uL (ref 4.0–10.5)
nRBC: 0 % (ref 0.0–0.2)

## 2024-02-28 LAB — TROPONIN T, HIGH SENSITIVITY: Troponin T High Sensitivity: <15 ng/L (ref <19)

## 2024-02-28 MED ORDER — PROMETHAZINE-DM 6.25-15 MG/5ML PO SYRP: 2.5000 mL | ORAL_SOLUTION | Freq: Four times a day (QID) | ORAL | 0 refills | Status: AC | PRN

## 2024-02-28 MED ORDER — CELECOXIB 200 MG PO CAPS: 200.0000 mg | ORAL_CAPSULE | Freq: Two times a day (BID) | ORAL | 0 refills | Status: AC | PRN

## 2024-02-28 MED ORDER — PANTOPRAZOLE SODIUM 20 MG PO TBEC: 20.0000 mg | DELAYED_RELEASE_TABLET | Freq: Every day | ORAL | 0 refills | Status: AC

## 2024-02-28 MED ORDER — LURASIDONE HCL 20 MG PO TABS: 20.0000 mg | ORAL_TABLET | Freq: Every day | ORAL | 0 refills | Status: AC

## 2024-02-28 NOTE — ED Triage Notes (Signed)
 Pt c/o cough x 1 month; CP x 1 wk; LT hand numbness x 2 wks; dizziness x a few days; describes CP as feels like gas; mouth feels dry

## 2024-02-28 NOTE — Discharge Instructions (Addendum)
 As discussed, your workup today was reassuring.  Heart enzyme and EKG appeared normal; does not appear like you are having a heart attack.  Your chest x-ray did not show obvious pneumonia, collapsed lung or other abnormality.  Otherwise, your blood test appeared normal.  Concerned that your cough/chest pain could be associated with some underlying reflux or GERD.  Will treat your symptoms with a reflux medication as well as cough medicine to use as needed.  Recommend follow-up with primary care for reevaluation.  Regarding the tingling in your left hand, concerned that you may have cubital tunnel syndrome.  Will send you home with anti-inflammatories to take as needed for this.  Recommend follow-up with orthopedic for reassessment.

## 2024-02-28 NOTE — ED Provider Notes (Signed)
 [place] [facility] Provider Note   CSN: [id number] Arrival date & time: 02/28/24  2146     Patient presents with: Cough and Chest Pain   Anthony Mullen is a 38 y.o. male.    Cough Associated symptoms: chest pain   Chest Pain Associated symptoms: cough     38 year old male presents emergency department with a few different complaints.  Patient reports having chest pain.  Describes chest pain as left-sided and somewhat random in nature.  States that he notices it mainly when lying down.  Denies any exertional worsening symptoms.  Does have a feels slightly short of breath whenever symptoms occur.  States that he is currently not experiencing this symptom.  Patient also reports tingling in his left hand.  States that this is also intermittent in nature.  Typically involves his ring and pinky finger with some radiation to his left forearm.  Denies any current sensation or weakness in his left hand/arm.  Patient also states that he had a cough for the past month.  Describes it as dry and nonproductive.  Denies any fevers, chills.  Presents emergency department for further assessment.  Past medical history significant for hypertension, bipolar 1 disorder  Prior to Admission medications   Medication Sig Start Date End Date Taking? Authorizing Provider  amoxicillin -clavulanate (AUGMENTIN ) 875-125 MG tablet Take 1 tablet by mouth every 12 (twelve) hours. 12/27/21   Patt Alm Macho, MD  lamoTRIgine (LAMICTAL) 25 MG tablet Take 50 mg by mouth daily. 12/11/20   [provider]    Allergies: Benadryl [diphenhydramine hcl]    Review of Systems  Respiratory:  Positive for cough.   Cardiovascular:  Positive for chest pain.  All other systems reviewed and are negative.   Updated Vital Signs BP 139/83 (BP Location: Right Arm)   Pulse 91   Temp 98.5 F (36.9 C)   Resp 18   Ht 5' 9 (1.753 m)   Wt 128.4 kg   SpO2 98%   BMI 41.79 kg/m    Physical Exam Vitals and nursing note reviewed.  Constitutional:      General: He is not in acute distress.    Appearance: He is well-developed.  HENT:     Head: Normocephalic and atraumatic.  Eyes:     Conjunctiva/sclera: Conjunctivae normal.  Cardiovascular:     Rate and Rhythm: Normal rate and regular rhythm.     Pulses: Normal pulses.     Heart sounds: No murmur heard. Pulmonary:     Effort: Pulmonary effort is normal. No respiratory distress.     Breath sounds: Normal breath sounds. No wheezing, rhonchi or rales.  Chest:     Chest wall: No tenderness.  Abdominal:     Palpations: Abdomen is soft.     Tenderness: There is no abdominal tenderness. There is no rebound.  Musculoskeletal:        General: No swelling.     Cervical back: Neck supple.     Comments: MSK strength 5/5 bilateral upper extremities.  Radial pulses 2+ bilaterally.  Full active Range of motion of bilateral upper lower extremities.  No sensory deficits along major nerve distributions of upper extremities.  Skin:    General: Skin is warm and dry.     Capillary Refill: Capillary refill takes less than 2 seconds.  Neurological:     Mental Status: He is alert.  Psychiatric:        Mood and Affect: Mood normal.     (  all labs ordered are listed, but only abnormal results are displayed) Labs Reviewed  BASIC METABOLIC PANEL WITH GFR - Abnormal; Notable for the following components:      Result Value   CO2 21 (*)    Anion gap 16 (*)    All other components within normal limits  CBC WITH DIFFERENTIAL/PLATELET - Abnormal; Notable for the following components:   MCV 78.5 (*)    MCH 25.7 (*)    All other components within normal limits  RESP PANEL BY RT-PCR (RSV, FLU A&B, COVID)  [id number]  TROPONIN T, HIGH SENSITIVITY    EKG: None  Radiology: DG Chest 2 View Result Date: 02/28/2024 CLINICAL DATA:  Cough for 1 month. Chest pain. Left hand numbness. Dizziness. EXAM: CHEST - 2 VIEW COMPARISON:  08/06/2016  FINDINGS: The cardiomediastinal contours are normal. The lungs are clear. Pulmonary vasculature is normal. No consolidation, pleural effusion, or pneumothorax. No acute osseous abnormalities are seen. IMPRESSION: No active cardiopulmonary disease. Electronically Signed   By: Andrea Gasman M.D.   On: 02/28/2024 22:31     Procedures   Medications Ordered in the ED - No data to display                                  Medical Decision Making Amount and/or Complexity of Data Reviewed Labs: ordered. Radiology: ordered.   This patient presents to the ED for concern of chest pain, cough, this involves an extensive number of treatment options, and is a complaint that carries with it a high risk of complications and morbidity.  The differential diagnosis includes ACS, PE, pneumothorax, pneumonia, MI, msk, gerd, asthma, other   Co morbidities that complicate the patient evaluation  See HPI   Additional history obtained:  Additional history obtained from EMR External records from outside source obtained and reviewed including hospital records   Lab Tests:  I Ordered, and personally interpreted labs.  The pertinent results include: No leukocytosis.  No evidence of anemia.  Platelets within range.  Decreased bicarb of 21 otherwise electrolytes no renal dysfunction.  Viral testing negative.  Troponin of less than 15.   Imaging Studies ordered:  I ordered imaging studies including chest x-Anthony I independently visualized and interpreted imaging which showed no acute cardiopulmonary abnormality I agree with the radiologist interpretation   Cardiac Monitoring: / EKG:  The patient was maintained on a cardiac monitor.  I personally viewed and interpreted the cardiac monitored which showed an underlying rhythm of: Sinus rhythm   Consultations Obtained:  N/a   Problem List / ED Course / Critical interventions / Medication management  Cough, chest pain, tingling in left  hand Reevaluation of the patient showed that the patient stayed the same I have reviewed the patients home medicines and have made adjustments as needed   Social Determinants of Health:  Denies tobacco, licit drug use.   Test / Admission - Considered:  Cough, chest pain, tingling in left hand Vitals signs within normal range and stable throughout visit. Laboratory/imaging studies significant for: See above 38 year old male presents emergency department with a few different complaints.  Patient reports having chest pain.  Describes chest pain as left-sided and somewhat random in nature.  States that he notices it mainly when lying down.  Denies any exertional worsening symptoms.  Does have a feels slightly short of breath whenever symptoms occur.  States that he is currently not experiencing this symptom.  Patient also reports tingling in his left hand.  States that this is also intermittent in nature.  Typically involves his ring and pinky finger with some radiation to his left forearm.  Denies any current sensation or weakness in his left hand/arm.  Patient also states that he had a cough for the past month.  Describes it as dry and nonproductive.  Denies any fevers, chills.  Presents emergency department for further assessment. On exam, lungs clear to auscultation bilaterally.  No abdominal tenderness.  Workup today reassuring.  Negative troponin, lack of acute ischemic change on EKG; low suspicion for ACS.  Patient with heart score of 0-3.  Given duration of patient's symptoms, second troponin deemed unnecessary.  Patient Wells PE 0 and PERC negative; low suspicion for PE.  Patient without chest pain rating to back, pulse deficits, neurodeficits; low suspicion for aortic dissection.  Chest x-Anthony was obtained which did not show evidence of pneumonia, pneumothorax or other acute cardiopulmonary abnormality.  With chest pain and this chronic cough, concerned that there could be GERD especially that  chest pain seems somewhat positional when he is lying down.  Will trial reflux medication and assess patient's response.  Regarding tingling sensation in the pinky and ring finger with some pain that radiates up the arm.  No obvious weakness on exam.  Patient not currently endorsing any symptoms.  Questionable possible cubital tunnel syndrome as there is some reproducible tenderness in that area.  Will trial NSAIDs, RICE and follow-up with orthopedics.  Treatment plan discussed with patient and he acknowledged and stated was agreeable to said plan.  Patient will well-appearing, afebrile in no acute distress. Worrisome signs and symptoms were discussed with the patient, and the patient acknowledged understanding to return to the ED if noticed. Patient was stable upon discharge.       Final diagnoses:  None    ED Discharge Orders     None          Silver Wonda LABOR, [place] 02/28/24 [id number]    Pamella Ozell LABOR, DO 03/01/24 1539
